# Patient Record
Sex: Female | Born: 1985 | Race: Black or African American | Hispanic: No | Marital: Single | Smoking: Former smoker
Health system: Southern US, Community
[De-identification: ages and names within clinical notes are randomized; demographics above are authoritative.]

## PROBLEM LIST (undated history)

## (undated) DIAGNOSIS — E0500 Thyrotoxicosis with diffuse goiter without thyrotoxic crisis or storm: Secondary | ICD-10-CM

## (undated) HISTORY — PX: NOSE SURGERY: SHX723

## (undated) HISTORY — PX: WRIST SURGERY: SHX841

## (undated) HISTORY — PX: ECTOPIC PREGNANCY SURGERY: SHX613

---

## 2015-03-10 ENCOUNTER — Encounter (HOSPITAL_COMMUNITY): Payer: Self-pay

## 2015-03-10 ENCOUNTER — Emergency Department (HOSPITAL_COMMUNITY)
Admission: EM | Admit: 2015-03-10 | Discharge: 2015-03-10 | Disposition: A | Discharge status: Home / Self Care | Payer: Medicaid Other | Attending: Emergency Medicine | Admitting: Emergency Medicine

## 2015-03-10 DIAGNOSIS — R000 Tachycardia, unspecified: Secondary | ICD-10-CM | POA: Diagnosis not present

## 2015-03-10 DIAGNOSIS — N730 Acute parametritis and pelvic cellulitis: Secondary | ICD-10-CM | POA: Diagnosis not present

## 2015-03-10 DIAGNOSIS — R42 Dizziness and giddiness: Secondary | ICD-10-CM | POA: Diagnosis present

## 2015-03-10 DIAGNOSIS — N760 Acute vaginitis: Secondary | ICD-10-CM | POA: Diagnosis not present

## 2015-03-10 DIAGNOSIS — B9689 Other specified bacterial agents as the cause of diseases classified elsewhere: Secondary | ICD-10-CM

## 2015-03-10 DIAGNOSIS — Z87891 Personal history of nicotine dependence: Secondary | ICD-10-CM | POA: Insufficient documentation

## 2015-03-10 DIAGNOSIS — E0590 Thyrotoxicosis, unspecified without thyrotoxic crisis or storm: Secondary | ICD-10-CM

## 2015-03-10 DIAGNOSIS — Z880 Allergy status to penicillin: Secondary | ICD-10-CM | POA: Diagnosis not present

## 2015-03-10 DIAGNOSIS — Z3202 Encounter for pregnancy test, result negative: Secondary | ICD-10-CM | POA: Insufficient documentation

## 2015-03-10 DIAGNOSIS — Z79899 Other long term (current) drug therapy: Secondary | ICD-10-CM | POA: Insufficient documentation

## 2015-03-10 HISTORY — DX: Thyrotoxicosis with diffuse goiter without thyrotoxic crisis or storm: E05.00

## 2015-03-10 LAB — CBC
HCT: 42.8 % (ref 36.0–46.0)
Hemoglobin: 14.8 g/dL (ref 12.0–15.0)
MCH: 28.6 pg (ref 26.0–34.0)
MCHC: 34.6 g/dL (ref 30.0–36.0)
MCV: 82.6 fL (ref 78.0–100.0)
PLATELETS: 319 10*3/uL (ref 150–400)
RBC: 5.18 MIL/uL — AB (ref 3.87–5.11)
RDW: 11.5 % (ref 11.5–15.5)
WBC: 5.3 10*3/uL (ref 4.0–10.5)

## 2015-03-10 LAB — BASIC METABOLIC PANEL
Anion gap: 9 (ref 5–15)
BUN: 12 mg/dL (ref 6–20)
CALCIUM: 9.8 mg/dL (ref 8.9–10.3)
CO2: 26 mmol/L (ref 22–32)
CREATININE: 0.58 mg/dL (ref 0.44–1.00)
Chloride: 103 mmol/L (ref 101–111)
GFR calc non Af Amer: >60 mL/min (ref >60)
Glucose, Bld: 153 mg/dL — ABNORMAL HIGH (ref 65–99)
Potassium: 3.4 mmol/L — ABNORMAL LOW (ref 3.5–5.1)
SODIUM: 138 mmol/L (ref 135–145)

## 2015-03-10 LAB — I-STAT BETA HCG BLOOD, ED (MC, WL, AP ONLY)

## 2015-03-10 LAB — WET PREP, GENITAL
SPERM: NONE SEEN
Trich, Wet Prep: NONE SEEN
YEAST WET PREP: NONE SEEN

## 2015-03-10 LAB — TSH: TSH: <0.01 u[IU]/mL — ABNORMAL LOW (ref 0.350–4.500)

## 2015-03-10 MED ORDER — DEXTROSE 5 % IV SOLN
1.0000 g | Freq: Once | INTRAVENOUS | Status: AC
  Administered 2015-03-10: 1 g via INTRAVENOUS
  Filled 2015-03-10: qty 10

## 2015-03-10 MED ORDER — METRONIDAZOLE 500 MG PO TABS: 500.0000 mg | ORAL_TABLET | Freq: Two times a day (BID) | ORAL | Status: DC

## 2015-03-10 MED ORDER — DOXYCYCLINE HYCLATE 100 MG PO CAPS: 100.0000 mg | ORAL_CAPSULE | Freq: Two times a day (BID) | ORAL | Status: DC

## 2015-03-10 MED ORDER — METRONIDAZOLE 500 MG PO TABS
500.0000 mg | ORAL_TABLET | Freq: Once | ORAL | Status: AC
  Administered 2015-03-10: 500 mg via ORAL
  Filled 2015-03-10: qty 1

## 2015-03-10 MED ORDER — SODIUM CHLORIDE 0.9 % IV BOLUS (SEPSIS)
1000.0000 mL | Freq: Once | INTRAVENOUS | Status: AC
  Administered 2015-03-10: 1000 mL via INTRAVENOUS

## 2015-03-10 MED ORDER — DOXYCYCLINE HYCLATE 100 MG PO TABS
100.0000 mg | ORAL_TABLET | Freq: Once | ORAL | Status: AC
  Administered 2015-03-10: 100 mg via ORAL
  Filled 2015-03-10: qty 1

## 2015-03-10 NOTE — ED Notes (Signed)
Pt presents with c/o tachycardia and dizziness. Pt reports she normally takes xanax and has been out of her prescription. Pt also reports she has Graves disease. Pt reports she has been feeling lightheaded and tired.

## 2015-03-10 NOTE — Discharge Instructions (Signed)
Please resume taking your hyperthyroid medications. It is very important to be compliant with these until you establish primary care and they can be adjusted properly.  Take your antibiotics as directed and to completion. You should never have any leftover antibiotics! Push fluids and stay well hydrated.   Any antibiotic use can reduce the efficacy of hormonal birth control. Please use back up method of contraception.   Do not drink alcohol while you are taking flagyl (metronidazole) because it will make you very sick.  Do not hesitate to return to the emergency room for any new, worsening or concerning symptoms.  Please obtain primary care using resource guide below. Let them know that you were seen in the emergency room and that they will need to obtain records for further outpatient management.   Pelvic Inflammatory Disease Pelvic inflammatory disease (PID) refers to an infection in some or all of the female organs. The infection can be in the uterus, ovaries, fallopian tubes, or the surrounding tissues in the pelvis. PID can cause abdominal or pelvic pain that comes on suddenly (acute pelvic pain). PID is a serious infection because it can lead to lasting (chronic) pelvic pain or the inability to have children (infertility). CAUSES This condition is most often caused by an infection that is spread during sexual contact. However, the infection can also be caused by the normal bacteria that are found in the vaginal tissues if these bacteria travel upward into the reproductive organs. PID can also occur following:  The birth of a baby.  A miscarriage.  An abortion.  Major pelvic surgery.  The use of an intrauterine device (IUD).  A sexual assault. RISK FACTORS This condition is more likely to develop in women who:  Are younger than 28 years of age.  Are sexually active at [facility] age.  Use nonbarrier contraception.  Have multiple sexual partners.  Have sex with someone who  has symptoms of an STD (sexually transmitted disease).  Use oral contraception. At times, certain behaviors can also increase the possibility of getting PID, such as:  Using a vaginal douche.  Having an IUD in place. SYMPTOMS Symptoms of this condition include:  Abdominal or pelvic pain.  Fever.  Chills.  Abnormal vaginal discharge.  Abnormal uterine bleeding.  Unusual pain shortly after the end of a menstrual period.  Painful urination.  Pain with sexual intercourse.  Nausea and vomiting. DIAGNOSIS To diagnose this condition, your health care provider will do a physical exam and take your medical history. A pelvic exam typically reveals great tenderness in the uterus and the surrounding pelvic tissues. You may also have tests, such as:  Lab tests, including a pregnancy test, blood tests, and urine test.  Culture tests of the vagina and cervix to check for an STD.  Ultrasound.  A laparoscopic procedure to look inside the pelvis.  Examining vaginal secretions under a microscope. TREATMENT Treatment for this condition may involve one or more approaches.  Antibiotic medicines may be prescribed to be taken by mouth.  Sexual partners may need to be treated if the infection is caused by an STD.  For more severe cases, hospitalization may be needed to give antibiotics directly into a vein through an IV tube.  Surgery may be needed if other treatments do not help, but this is rare. It may take weeks until you are completely well. If you are diagnosed with PID, you should also be checked for human immunodeficiency virus (HIV). Your health care provider may test you  for infection again 3 months after treatment. You should not have unprotected sex. HOME CARE INSTRUCTIONS  Take over-the-counter and prescription medicines only as told by your health care provider.  If you were prescribed an antibiotic medicine, take it as told by your health care provider. Do not stop taking  the antibiotic even if you start to feel better.  Do not have sexual intercourse until treatment is completed or as told by your health care provider. If PID is confirmed, your recent sexual partners will need treatment, especially if you had unprotected sex.  Keep all follow-up visits as told by your health care provider. This is important. SEEK MEDICAL CARE IF:  You have increased or abnormal vaginal discharge.  Your pain does not improve.  You vomit.  You have a fever.  You cannot tolerate your medicines.  Your partner has an STD.  You have pain when you urinate. SEEK IMMEDIATE MEDICAL CARE IF:  You have increased abdominal or pelvic pain.  You have chills.  Your symptoms are not better in 72 hours even with treatment.   This information is not intended to replace advice given to you by your health care provider. Make sure you discuss any questions you have with your health care provider.   Document Released: 03/15/2005 Document Revised: 12/04/2014 Document Reviewed: 04/22/2014 Elsevier Interactive Patient Education 2016 [employer].    Emergency Department Resource Guide 1) Find a Doctor and Pay Out of Pocket Although you won't have to find out who is covered by your insurance plan, it is a good idea to ask around and get recommendations. You will then need to call the office and see if the doctor you have chosen will accept you as a new patient and what types of options they offer for patients who are self-pay. Some doctors offer discounts or will set up payment plans for their patients who do not have insurance, but you will need to ask so you aren't surprised when you get to your appointment.  2) Contact Your Local Health Department Not all health departments have doctors that can see patients for sick visits, but many do, so it is worth a call to see if yours does. If you don't know where your local health department is, you can check in your phone book. The CDC also  has a tool to help you locate your state's health department, and many state websites also have listings of all of their local health departments.  3) Find a Walk-in Clinic If your illness is not likely to be very severe or complicated, you may want to try a walk in clinic. These are popping up all over the country in pharmacies, drugstores, and shopping centers. They're usually staffed by nurse practitioners or physician assistants that have been trained to treat common illnesses and complaints. They're usually fairly quick and inexpensive. However, if you have serious medical issues or chronic medical problems, these are probably not your best option.  No Primary Care Doctor: - Call Health Connect at  [phone] - they can help you locate a primary care doctor that  accepts your insurance, provides certain services, etc. - Physician Referral Service- [phone]  Chronic Pain Problems: Organization         Address  Phone   Notes  [facility]  [phone] Patients need to be referred by their primary care doctor.   Medication Assistance: Retail buyer  Notes  [facility] Medication [facility] [address] [place] [place], [place] [address] [phone] --Must be a resident of [facility] -- Must have NO insurance coverage whatsoever (no Medicaid/ Medicare, etc.) -- The pt. MUST have a primary care doctor that directs their care regularly and follows them in the community   [facility]ist  [phone]   [employer]  [phone]    Agencies that provide inexpensive medical care: Organization         Address  Phone   Notes  [facility]  [phone]   [facility]    [phone]   [facility] [address] [place], [place] [address] [phone]   [facility] [address] [place] [phone]   Planned Parenthood    [phone]    [facility]    [phone]   [facility]  [address], [place] Phone:  [phone], Fax:  [phone] Hours of Operation:  9 am - 6 pm, M-F.  Also accepts Medicaid/Medicare and self-pay.  [facility]  [address], [place] Phone: [phone], Fax: [phone]. Hours of Operation:  8:30 am - 5:30 pm, M-F.  Also accepts Medicaid and self-pay.  [facility] [place] [address], [place] Phone: [phone]   [facility] [address] Natasha Bence [place], [place] [phone], Ext. [phone] Mondays & Thursdays: 7-9 AM.  First 15 patients are seen on a first come, first serve basis.    Medicaid-accepting [facility] Providers:  Organization         Address  Phone   Notes  [facility] [address], [place] [phone] Also accepts self-pay patients.  [facility] [address] Laurell Josephs [place], [place]  [phone]   [facility] [address], [place] [phone]   [facility] [address], [place] [phone]   Renaye Rakers [address], [place]   [phone] Only accepts [place] Access [place] patients after they have their name applied to their card.   Self-Pay (no insurance) in [employer]:  Organization         Address  Phone   Notes  Sickle Cell Patients, [facility] [address] [place], [place] [phone]   [facility] [address] [place], [place] [phone]   [facility] [place]  [address], [place] [phone]   [facility]/Dr. Osei-Bonsu  [address] [place] or [address], [place] [phone] Phone number for both [place] and [place] locations is the same.  [facility] [address] [place] [phone]   [facility] [address], [place] or [address] [phone]   [facility] [address], [place] [phone], phone; [phone], fax Sees patients 1st and 3rd Saturday of every month.  Must not qualify for public or private insurance (i.e. Medicaid, Medicare, [place] Health Choice, Veterans' Benefits)  Household income should be no more than 200% of the poverty level The clinic cannot treat you if you are pregnant or think you are pregnant  Sexually transmitted  diseases are not treated at the clinic.    Dental Care: Organization         Address  Phone  Notes  [facility] [address], [place] [phone] Accepts children up to age 28 who are enrolled in [place] or [place] Health Choice; pregnant women with a Medicaid card; and children who have applied for Medicaid or [place] Health Choice, but were declined, whose parents can pay a reduced fee at time of service.  [facility] [place]  [address], [place] [phone] Accepts children up to age 28 who are enrolled in [place] or [place] Health Choice; pregnant women with a Medicaid card; and children who have applied for Medicaid or  Health Choice, but were declined, whose parents can pay a reduced fee at time of service.  [facility]  [address], [place] [phone] Patients are seen by appointment only. Walk-ins are not accepted. [facility] will see patients 28 years of age and older. Monday - Tuesday (8am-5pm) Most Wednesdays (8:30-5pm) $30 per visit, cash only  [facility]  [address], [facility] [phone] Patients are seen by appointment only. Walk-ins are not accepted. [facility] will see patients 28 years of age and older. One Wednesday  Evening (Monthly: Volunteer Based).  $30 per visit, cash only  [facility]  [phone] for adults; Children under age 24, call Graduate Pediatric Dentistry at [phone]. Children aged 634-14, please call [phone] to request a pediatric application.  Dental services are provided in all areas of dental care including fillings, crowns and bridges, complete and partial dentures, implants, gum treatment, root canals, and extractions. Preventive care is also provided. Treatment is provided to both adults and children. Patients are selected via a lottery and there is often a waiting list.   [facility] [address], [place]  [phone] [email or web address]   [facility] [address], [place], [place] [phone], Ext. [phone] Second and Fourth Thursday of each month, opens at 6:30 AM; Clinic ends at 9 AM.  Patients are seen on a first-come first-served basis, and a limited number are seen during each clinic.   [facility]  [address], [place], [place] [phone]   Eligibility Requirements You must have lived in [place], [place], or [place] counties for at least the last three months.   You cannot be eligible for state or federal sponsored [place] Cityhealthcare insurance, including CIGNAVeterans Administration, [place]icaid, or [employer]icare.   You generally cannot be eligible for healthcare insurance through your employer.    How to apply: Eligibility screenings are held every Tuesday and Wednesday afternoon from 1:00 pm until 4:00 pm. You do not need an appointment for the interview!  [facility] [address], [place], [place] [phone]   [facility]  [phone]   [facility]  [phone]   [facility]  [phone]    Behavioral Health Resources in the Community: Intensive Outpatient Programs Organization         Address  Phone  Notes  [facility] [address], [place], [place] [phone]   [facility] [address], [place], [place] [phone]   [facility] [address], [place], [place]  [phone]  [facility] [address]  [place], [place] [phone] or [phone]   Substance Abuse Resources Organization         Address  Phone  Notes  [facility] Drug [facility]  [phone]   [facility]  [phone]   [facility]  [phone]   Floydene Flock  [phone]   Residential & Outpatient Substance Abuse Program  [phone]   Psychological Services Organization         Address  Phone  Notes  [facility]  [phone]   [facility] Services  [phone]   [facility] Health [address] [place] [phone] or [phone]    Mobile Crisis Teams Organization         Address  Phone  Notes  [facility], Mobile Crisis Care Unit  [phone]   [facility]  [address] [place], [place] [phone]   Doristine Locks [address] [place] [phone]    Self-Help/Support Groups Organization         Address  Phone             Notes  Mental Health Assoc. of  - variety of support groups  [phone]- I7437963 Call for more information  Narcotics Anonymous (NA), [facility] [address], [employer] [place]  2 meetings at this location   Statistician         Address  Phone  Notes  [facility] [address],    [place]  [phone]   [facility]  [address], [place] [address], [place], [place] [phone]   [facility] [address] [place], [place] [phone] Admissions: 8am-3pm M-F  [facility] [address]    [place], [place] [phone]   [facility] [address] [place],  [place], [place] [phone]   [facility] [address]  [place], [place] [phone]   [facility] - Intensive Outpatient [address]ure[address], [place], [place] [phone]   [facility] [address] [place]  [place], [place] [phone] or [phone]   [facility] ([facility]) [address] [place], [place] [phone] Accepts Medicaid  [facility] [address],  [place] [phone] Substance Abuse/Addiction Treatment   [facility] Organization         Address  Phone  Notes  [facility]  [phone]   Angie Fava, PhD [address] Ervin Knack [place], [place]   [phone] or [phone]   [facility] Behavioral   [address] [place], [place] [phone]   [facility] [address] [place], [place] [phone] Insurance/Medicaid/sponsorship through [facility] [address]                                    [place], [place] [phone] Therapy/tele-psych/case  [facility] [address], [place] [phone]    Dr. Lolly Mustache  [phone]   [facility]  [employer] [facility] Dept. 1) [address] [place] 2) [address], [place] 3)  [address], [place] [phone]  [phone]   [facility] Child Abuse Hotline [phone] or [phone] (After Hours)

## 2015-03-10 NOTE — ED Notes (Addendum)
EDP Tara Wallace(Nicole PA) at bedside. Pt states she has not been taking her medication for her graves disease

## 2015-03-10 NOTE — ED Provider Notes (Signed)
CSN: [id number]     Arrival date & time 03/10/15  1704 History   First MD Initiated Contact with Patient 03/10/15 1927     Chief Complaint  Patient presents with  . Tachycardia  . Dizziness     (Consider location/radiation/quality/duration/timing/severity/associated sxs/prior Treatment) HPI   Blood pressure 120/79, pulse 123, temperature 97.8 F (36.6 C), temperature source Oral, resp. rate 18, last menstrual period 02/01/2015, SpO2 97 %.  Tara Wallace is a 29 y.o. female complaining of tachycardia and excessive sleepiness over the course of last 2 weeks. Patient states this is similar to when she was diagnosed with hyperthyroid/Graves'. Patient recently stopped taking her methimazole and propranolol that a week ago days ago. States that it was not helping her. Patient recently moved to the area and has not established primary care. Patient denies chest pain, shortness of breath, history of DVT or PE. Patient states that her ex boyfriend reports that he has an STD, patient denies abnormal vaginal discharge, fever, chills, abdominal pain, rash or lesion.  Past Medical History  Diagnosis Date  . Graves disease    Past Surgical History  Procedure Laterality Date  . Ectopic pregnancy surgery    . Nose surgery    . Wrist surgery     No family history on file. Social History  Substance Use Topics  . Smoking status: Former Games developer  . Smokeless tobacco: None  . Alcohol Use: Yes     Comment: occasionally    OB History    No data available     Review of Systems  10 systems reviewed and found to be negative, except as noted in the HPI.   Allergies  Penicillins  Home Medications   Prior to Admission medications   Medication Sig Start Date End Date Taking? Authorizing Provider  ALPRAZOLAM PO Take 0.5 mg by mouth 2 (two) times daily as needed (anxiety.).    Yes Historical Provider, MD  doxycycline (VIBRAMYCIN) 100 MG capsule Take 1 capsule (100 mg total) by mouth 2 (two) times  daily. 03/10/15   Atley Scarboro, PA-C  methimazole (TAPAZOLE) 10 MG tablet Take 10 mg by mouth 2 (two) times daily.   Yes Historical Provider, MD  metroNIDAZOLE (FLAGYL) 500 MG tablet Take 1 tablet (500 mg total) by mouth 2 (two) times daily. 03/10/15   Dorri Ozturk, PA-C  PROPRANOLOL HCL PO Take 40 mg by mouth 3 (three) times daily.    Yes Historical Provider, MD   BP 140/67 mmHg  Pulse 119  Temp(Src) 97.8 F (36.6 C) (Oral)  Resp 23  SpO2 99%  LMP 02/01/2015 (Approximate) Physical Exam  Constitutional: She is oriented to person, place, and time. She appears well-developed and well-nourished. No distress.  HENT:  Head: Normocephalic.  Mouth/Throat: Oropharynx is clear and moist.  Eyes: Conjunctivae and EOM are normal. Pupils are equal, round, and reactive to light.  Neck: Normal range of motion. No JVD present. No tracheal deviation present.  Cardiovascular: Regular rhythm and intact distal pulses.   Tachycardic in the 1 teens  Pulmonary/Chest: Effort normal and breath sounds normal. No stridor. No respiratory distress. She has no wheezes. She has no rales. She exhibits no tenderness.  Abdominal: Soft. Bowel sounds are normal. She exhibits no distension and no mass. There is no tenderness. There is no rebound and no guarding.  Genitourinary:  Pelvic exam chaperoned by technician: No rashes or lesions, there is a thick, white vaginal discharge. Mild cervical motion tenderness with no adnexal tenderness  Musculoskeletal: Normal  range of motion. She exhibits no edema or tenderness.  No calf asymmetry, superficial collaterals, palpable cords, edema, Homans sign negative bilaterally.    Neurological: She is alert and oriented to person, place, and time.  Skin: Skin is warm. She is not diaphoretic.  Psychiatric: She has a normal mood and affect.  Nursing note and vitals reviewed.   ED Course  Procedures (including critical care time) Labs Review Labs Reviewed  WET PREP,  GENITAL - Abnormal; Notable for the following:    Clue Cells Wet Prep HPF POC PRESENT (*)    WBC, Wet Prep HPF POC MANY (*)    All other components within normal limits  BASIC METABOLIC PANEL - Abnormal; Notable for the following:    Potassium 3.4 (*)    Glucose, Bld 153 (*)    All other components within normal limits  CBC - Abnormal; Notable for the following:    RBC 5.18 (*)    All other components within normal limits  TSH - Abnormal; Notable for the following:    TSH <0.010 (*)    All other components within normal limits  URINALYSIS, ROUTINE W REFLEX MICROSCOPIC (NOT AT [facility])  RPR  HIV ANTIBODY (ROUTINE TESTING)  T4, FREE  I-STAT BETA HCG BLOOD, ED ([facility], [facility], [facility] ONLY)  GC/CHLAMYDIA PROBE AMP ([place]) NOT AT [facility]    Imaging Review No results found. I have personally reviewed and evaluated these images and lab results as part of my medical decision-making.   EKG Interpretation   Date/Time:  Monday March 10 2015 17:10:36 EST Ventricular Rate:  118 PR Interval:  125 QRS Duration: 79 QT Interval:  300 QTC Calculation: 420 R Axis:   66 Text Interpretation:  Sinus tachycardia no prior ekg for comparison  Confirmed by LIU MD, DANA [phone]) on 03/10/2015 11:01:49 PM      MDM   Final diagnoses:  Hyperthyroidism  Bacterial vaginosis  PID (acute pelvic inflammatory disease)    Filed Vitals:   03/10/15 1711 03/10/15 2000 03/10/15 2030 03/10/15 2100  BP: 120/79 108/71 128/80 140/67  Pulse: 123 115 101 119  Temp: 97.8 F (36.6 C)     TempSrc: Oral     Resp: SpO2: 97% 99% 100% 99%    Medications  cefTRIAXone (ROCEPHIN) 1 g in dextrose 5 % 50 mL IVPB (1 g Intravenous New Bag/Given 03/10/15 2236)  sodium chloride 0.9 % bolus 1,000 mL (1,000 mLs Intravenous New Bag/Given 03/10/15 2025)  doxycycline (VIBRA-TABS) tablet 100 mg (100 mg Oral Given 03/10/15 2235)  metroNIDAZOLE (FLAGYL) tablet 500 mg (500 mg Oral Given 03/10/15 2235)    [name]  is 29 y.o. female presenting with tachycardia and somnolence consistent with prior episode in which her hyperthyroidism was initially diagnosed. Patient was taking methimazole and propranolol but she stopped taking this approximately one week ago. She states it wasn't helping her. She is mildly tachycardic with a heart rate of 119. EKG shows sinus tachycardia. Patient without chest pain. TSH is undetectable. I doubt that this is thyroid storm, she is afebrile mentating normally.  Patient also requests to be checked for STDs. She has mild cervical motion tenderness and thick discharge. Wet prep shows clue cells and many white blood cells. We'll treat for BV/PID. Patient advised to follow closely with primary care.  Evaluation does not show pathology that would require ongoing emergent intervention or inpatient treatment. Pt is hemodynamically stable and mentating appropriately. Discussed findings and plan with patient/guardian, who  agrees with care plan. All questions answered. Return precautions discussed and outpatient follow up given.   New Prescriptions   DOXYCYCLINE (VIBRAMYCIN) 100 MG CAPSULE    Take 1 capsule (100 mg total) by mouth 2 (two) times daily.   METRONIDAZOLE (FLAGYL) 500 MG TABLET    Take 1 tablet (500 mg total) by mouth 2 (two) times daily.         Wynetta Emeryicole Raman Featherston, PA-C 03/10/15 2302  Lavera Guiseana Duo Liu, MD 03/12/15 [phone]

## 2015-03-10 NOTE — ED Notes (Signed)
Pt will be discharged after her rocephin is finished running.

## 2015-03-11 LAB — HIV ANTIBODY (ROUTINE TESTING W REFLEX): HIV Screen 4th Generation wRfx: NONREACTIVE

## 2015-03-11 LAB — GC/CHLAMYDIA PROBE AMP (~~LOC~~) NOT AT ARMC
CHLAMYDIA, DNA PROBE: POSITIVE — AB
NEISSERIA GONORRHEA: NEGATIVE

## 2015-03-11 LAB — RPR: RPR Ser Ql: NONREACTIVE

## 2015-03-11 LAB — T4, FREE: FREE T4: 4.26 ng/dL — AB (ref 0.61–1.12)

## 2015-03-12 ENCOUNTER — Telehealth (HOSPITAL_BASED_OUTPATIENT_CLINIC_OR_DEPARTMENT_OTHER): Payer: Self-pay | Admitting: Emergency Medicine

## 2015-03-12 NOTE — Telephone Encounter (Signed)
+   chlamydia, was treated , attempting to contact patient

## 2015-03-21 ENCOUNTER — Telehealth (HOSPITAL_BASED_OUTPATIENT_CLINIC_OR_DEPARTMENT_OTHER): Payer: Self-pay | Admitting: Emergency Medicine

## 2015-05-20 ENCOUNTER — Telehealth (HOSPITAL_BASED_OUTPATIENT_CLINIC_OR_DEPARTMENT_OTHER): Payer: Self-pay | Admitting: Emergency Medicine

## 2015-06-04 DIAGNOSIS — E0590 Thyrotoxicosis, unspecified without thyrotoxic crisis or storm: Secondary | ICD-10-CM

## 2015-06-04 NOTE — Congregational Nurse Program (Signed)
Congregational Nurse Program Note  Date of Encounter: 06/04/2015  Past Medical History: Past Medical History  Diagnosis Date  . Graves disease     Encounter Details:     CNP Questionnaire - 06/04/15 1855    Patient Demographics   Is this a new or existing patient? New   Patient is considered a/an Not Applicable   Race African-American/Black   Patient Assistance   Location of Patient Assistance Not Applicable   Patient's financial/insurance status Medicaid   Uninsured Patient No   Patient referred to apply for the following financial assistance Medicaid   Food insecurities addressed Not Applicable   Transportation assistance No   Assistance securing medications No   Educational health offerings Chronic disease   Encounter Details   Primary purpose of visit Education/Health Concerns;Chronic Illness/Condition Visit;Spiritual Care/Support Visit   Was an Emergency Department visit averted? Not Applicable   Does patient have a medical provider? No   Patient referred to Establish PCP   Was a mental health screening completed? (GAINS tool) No   Does patient have dental issues? No   Does patient have vision issues? No   Since previous encounter, have you referred patient for abnormal blood pressure that resulted in a new diagnosis or medication change? No   Since previous encounter, have you referred patient for abnormal blood glucose that resulted in a new diagnosis or medication change? No   For Abstraction Use Only   Does patient have insurance? Yes    Initial  Visit  To  See nurse  , referred by  Case manager . States  She  Has a history of  Hyperthyroidism, takes her medication.Doesn't  Know  Why  She  Was referred to  The  Nurse. Nurse attempted to  Get  Client  To  Talk about  Self and has 3 children ,2 boys  Ages 498,6 and 1 daughter age 35.  States  Her daughter needs immunizations , not in preschool  But  Will start in the  Fall , [place] . Boys  Are attending   [employer].Nurse  Counseled  Regarding  Need to  Go DSS to  Have  Med[place]  [place]  And to  [facility]  [facility]  Here. Gave  Mother  A list of  Practices  Taking  New patients  For all  Family  Members and  Address for  DSS. Ask that she  Come  Weekly  To  Update  Nurse on her follow  Up  With  identifying  Medical homes. States she  Wanted to  Come back later as children have  gotten on her nerves , nurse counseled  and tried to  Make mother feel comfortable  to talking with  Me regarding her needs. Follow  Up  With  Case manger on client disposition and her attitude today . Client had other issues going on . Will follow  Up  Next week.

## 2015-06-06 ENCOUNTER — Emergency Department (HOSPITAL_COMMUNITY)
Admission: EM | Admit: 2015-06-06 | Discharge: 2015-06-06 | Disposition: A | Discharge status: Home / Self Care | Payer: Medicaid Other | Attending: Emergency Medicine | Admitting: Emergency Medicine

## 2015-06-06 ENCOUNTER — Encounter (HOSPITAL_COMMUNITY): Payer: Self-pay | Admitting: Emergency Medicine

## 2015-06-06 ENCOUNTER — Emergency Department (HOSPITAL_COMMUNITY): Payer: Medicaid Other

## 2015-06-06 DIAGNOSIS — Z3202 Encounter for pregnancy test, result negative: Secondary | ICD-10-CM | POA: Diagnosis not present

## 2015-06-06 DIAGNOSIS — Z87891 Personal history of nicotine dependence: Secondary | ICD-10-CM | POA: Diagnosis not present

## 2015-06-06 DIAGNOSIS — R002 Palpitations: Secondary | ICD-10-CM | POA: Diagnosis present

## 2015-06-06 DIAGNOSIS — Z792 Long term (current) use of antibiotics: Secondary | ICD-10-CM | POA: Diagnosis not present

## 2015-06-06 DIAGNOSIS — E0590 Thyrotoxicosis, unspecified without thyrotoxic crisis or storm: Secondary | ICD-10-CM | POA: Diagnosis not present

## 2015-06-06 DIAGNOSIS — Z880 Allergy status to penicillin: Secondary | ICD-10-CM | POA: Diagnosis not present

## 2015-06-06 DIAGNOSIS — R600 Localized edema: Secondary | ICD-10-CM | POA: Insufficient documentation

## 2015-06-06 DIAGNOSIS — Z79899 Other long term (current) drug therapy: Secondary | ICD-10-CM | POA: Insufficient documentation

## 2015-06-06 DIAGNOSIS — R000 Tachycardia, unspecified: Secondary | ICD-10-CM | POA: Diagnosis not present

## 2015-06-06 LAB — CBC
HCT: 35.5 % — ABNORMAL LOW (ref 36.0–46.0)
Hemoglobin: 11.8 g/dL — ABNORMAL LOW (ref 12.0–15.0)
MCH: 27.1 pg (ref 26.0–34.0)
MCHC: 33.2 g/dL (ref 30.0–36.0)
MCV: 81.6 fL (ref 78.0–100.0)
Platelets: 202 K/uL (ref 150–400)
RBC: 4.35 MIL/uL (ref 3.87–5.11)
RDW: 12.2 % (ref 11.5–15.5)
WBC: 4.6 K/uL (ref 4.0–10.5)

## 2015-06-06 LAB — BASIC METABOLIC PANEL
Anion gap: 8 (ref 5–15)
BUN: 10 mg/dL (ref 6–20)
CHLORIDE: 105 mmol/L (ref 101–111)
CO2: 25 mmol/L (ref 22–32)
CREATININE: 0.38 mg/dL — AB (ref 0.44–1.00)
Calcium: 9.6 mg/dL (ref 8.9–10.3)
GFR calc non Af Amer: >60 mL/min (ref >60)
GLUCOSE: 93 mg/dL (ref 65–99)
Potassium: 4.3 mmol/L (ref 3.5–5.1)
Sodium: 138 mmol/L (ref 135–145)

## 2015-06-06 LAB — TSH: TSH: <0.01 u[IU]/mL — ABNORMAL LOW (ref 0.350–4.500)

## 2015-06-06 LAB — T4, FREE: FREE T4: 4.65 ng/dL — AB (ref 0.61–1.12)

## 2015-06-06 LAB — I-STAT TROPONIN, ED: Troponin i, poc: 0 ng/mL (ref 0.00–0.08)

## 2015-06-06 LAB — POC URINE PREG, ED: Preg Test, Ur: NEGATIVE

## 2015-06-06 LAB — BRAIN NATRIURETIC PEPTIDE: B Natriuretic Peptide: 26.9 pg/mL (ref 0.0–100.0)

## 2015-06-06 MED ORDER — SODIUM CHLORIDE 0.9 % IV BOLUS (SEPSIS)
1000.0000 mL | Freq: Once | INTRAVENOUS | Status: AC
  Administered 2015-06-06: 1000 mL via INTRAVENOUS

## 2015-06-06 MED ORDER — SODIUM CHLORIDE 0.9 % IV SOLN
INTRAVENOUS | Status: DC
Start: 2015-06-06 — End: 2015-06-06
  Administered 2015-06-06: 18:00:00 via INTRAVENOUS

## 2015-06-06 MED ORDER — PROPRANOLOL HCL 20 MG PO TABS: 40.0000 mg | ORAL_TABLET | Freq: Three times a day (TID) | ORAL | Status: DC

## 2015-06-06 MED ORDER — METHIMAZOLE 10 MG PO TABS: 40.0000 mg | ORAL_TABLET | Freq: Two times a day (BID) | ORAL | Status: AC

## 2015-06-06 NOTE — ED Notes (Signed)
Lab delay - Pt doesn't want to be stuck twice - is opting to wait for IV.  Triage RN aware.

## 2015-06-06 NOTE — Progress Notes (Signed)
Medicaid [place] access response hx indicates pt pcp as  [facility] [address] Kendra [place], [place] [phone]

## 2015-06-06 NOTE — ED Notes (Addendum)
Pt reports palpitations since last night. HR 103 in triage. Pt has hx of anxiety, took home ativan at 0800 this am which decreased symptoms. Pt also reports L CP and HA.

## 2015-06-06 NOTE — Progress Notes (Signed)
Entered in d/c instructions  [facility] Schedule an appointment as soon as possible for a visit This is your assigned Medicaid [place] access doctor If you prefer another contact DSS [phone] DSS assigned your doctor *You may receive a bill if you go to any family Dr not assigned to you Medicaid [place] access response hx indicates [facility] [address] Kendra Op[place] [place], [place] [phone] medicaid [place]: [phone] [address] [place], [place] [id number] [email or web address].ncdhhs.gov/ Use this website to assist with understanding your coverage & to renew application As a Medicaid client you MUST contact DSS/SSI each time you change address, move to another [place] or another state to keep your address updated  [place] Medicaid Transportation to Dr appts if you are have full Medicaid: [phone], [phone]

## 2015-06-06 NOTE — ED Provider Notes (Signed)
CSN: [id number]     Arrival date & time 06/06/15  1124 History   First MD Initiated Contact with Patient 06/06/15 1554     Chief Complaint  Patient presents with  . Palpitations     (Consider location/radiation/quality/duration/timing/severity/associated sxs/prior Treatment) HPI   30 year old female with hx of Graves disease presenting with heart palpation and chest pain.  Pt sts for the past several months she has been having sxs concerning for Graves disease.  She report unexplained 30 lbs weight loss in 1 month, she has generalized weakness, dizzy spell, emotional lability, anxiety, occasion chest discomfort, generalized weakness, shortness of breath on exertion and sleeping a lot more than usual.  She had a syncopal episode yesterday and continues to feel fatigue with heart palpitation today. She have been having bilateral lower extremities swelling for several months. Endorse leg tremors and legs giving out on her for the past several days. She was diagnosed with Graves disease 2 years ago and have been taking methimazole and propranolol without adequate improvement.  She last saw her endocrinologist 1 year ago and was told she will need laser procedure of the thyroid but pt elected not to go with that option.  She has been taking ativan as needed for her anxiety with some improvement, last dose was yesterday.  She is having difficulty caring for her 3 young childs due to her generalized fatigue.  Pt is a former smoker, she drinks alcohol on occasion.  She denies any other cardiac hx, no prior hx of PE/DVT.    Past Medical History  Diagnosis Date  . Graves disease    Past Surgical History  Procedure Laterality Date  . Ectopic pregnancy surgery    . Nose surgery    . Wrist surgery     History reviewed. No pertinent family history. Social History  Substance Use Topics  . Smoking status: Former Games developer  . Smokeless tobacco: None  . Alcohol Use: Yes     Comment: occasionally    OB  History    No data available     Review of Systems  All other systems reviewed and are negative.     Allergies  Penicillins  Home Medications   Prior to Admission medications   Medication Sig Start Date End Date Taking? Authorizing Provider  ALPRAZOLAM PO Take 0.5 mg by mouth 2 (two) times daily as needed (anxiety.).     Historical Provider, MD  doxycycline (VIBRAMYCIN) 100 MG capsule Take 1 capsule (100 mg total) by mouth 2 (two) times daily. 03/10/15   Nicole Pisciotta, PA-C  methimazole (TAPAZOLE) 10 MG tablet Take 10 mg by mouth 2 (two) times daily.    Historical Provider, MD  metroNIDAZOLE (FLAGYL) 500 MG tablet Take 1 tablet (500 mg total) by mouth 2 (two) times daily. 03/10/15   Nicole Pisciotta, PA-C  PROPRANOLOL HCL PO Take 40 mg by mouth 3 (three) times daily.     Historical Provider, MD   BP 120/70 mmHg  Pulse 103  Temp(Src) 97.7 F (36.5 C) (Oral)  Resp 18  SpO2 99%  LMP 06/04/2015 Physical Exam  Constitutional: She is oriented to person, place, and time. She appears well-developed and well-nourished. No distress.  AAF lying in bed, no acute discomfort.  HENT:  Head: Atraumatic.  Right Ear: External ear normal.  Left Ear: External ear normal.  No proptosis or exopthalmos  Eyes: Conjunctivae are normal. Pupils are equal, round, and reactive to light.  Neck: Normal range of motion. Neck  supple. JVD present. No thyromegaly present.  Cardiovascular: Intact distal pulses.   Tachycardia without M/R/G  Pulmonary/Chest: Effort normal and breath sounds normal. No stridor. No respiratory distress. She has no wheezes. She has no rales. She exhibits no tenderness.  Abdominal: Soft. There is no tenderness.  Musculoskeletal: She exhibits edema (bilateral 1+ pitting edema to lower extremities).  4/5 strengths to all 4 extremities without focal weakness  Neurological: She is alert and oriented to person, place, and time.  Skin: No rash noted.  Psychiatric: She has a normal  mood and affect.  Nursing note and vitals reviewed.   ED Course  Procedures (including critical care time) Labs Review Labs Reviewed  BASIC METABOLIC PANEL - Abnormal; Notable for the following:    Creatinine, Ser 0.38 (*)    All other components within normal limits  TSH - Abnormal; Notable for the following:    TSH <0.010 (*)    All other components within normal limits  T4, FREE - Abnormal; Notable for the following:    Free T4 4.65 (*)    All other components within normal limits  CBC - Abnormal; Notable for the following:    Hemoglobin 11.8 (*)    HCT 35.5 (*)    All other components within normal limits  BRAIN NATRIURETIC PEPTIDE  T3, FREE  I-STAT TROPOININ, ED  POC URINE PREG, ED    Imaging Review Dg Chest 2 View  06/06/2015  CLINICAL DATA:  Chest pain EXAM: CHEST  2 VIEW COMPARISON:  None. FINDINGS: The heart size and mediastinal contours are within normal limits. Both lungs are clear. Thoracic dextroscoliosis. IMPRESSION: No active cardiopulmonary disease. Electronically Signed   By: Marlan Palau M.D.   On: 06/06/2015 13:13   I have personally reviewed and evaluated these images and lab results as part of my medical decision-making.   EKG Interpretation None     ED ECG REPORT   Date: 06/06/2015  Rate: 108  Rhythm: sinus tachycardia  QRS Axis: normal  Intervals: normal  ST/T Wave abnormalities: ST elevations diffusely  Conduction Disutrbances:none  Narrative Interpretation: ST elevation suggests acute pericarditis  Old EKG Reviewed: none available  I have personally reviewed the EKG tracing and agree with the computerized printout as noted.   MDM   Final diagnoses:  Hyperthyroidism    BP 128/65 mmHg  Pulse 103  Temp(Src) 97.7 F (36.5 C) (Oral)  Resp 24  SpO2 100%  LMP 06/04/2015   4:19 PM Pt presents with sxs suggestive of uncontrolled hyperthyroidism in the setting of Graves disease.  Work up initiated.  Will check for signs of high  output heart failure.  Pt is mentating appropriately, doubt thyroitoxicosis.  Pt however has had several syncopal episodes along with having increase generalized weakness and fatigue.  She has not seen an endocrinologist for over a year and although report being compliant with her thyroid medication her sxs worsen.  She may benefit for admission and get linked up with an endocrinologist.  Care discussed with DR. Verdie Mosher.    6:38 PM Patient with normal orthostatic vital signs. She remains tachycardic despite receiving IV fluid.her pregnancy test is negative. EKG shows tach exacerbation to suggest acute pericarditis but I do not think her symptoms consistence with pericarditis. She has normal BNP. Her TSH is low consistence with hypothyroidism. Free T3 in T4 has not resulted yet. the chest x-rays normals no signs of pleural effusion. EKG without signs of afib.    7:51 PM Pt is not in  thyroid storm.  Appreciate consultation from endocrinologist DR. Nelwyn SalisburyShawn Ellison [phone]) who suggest pt increasing Methimazole to 40mg  BID and to f/u in his office on Tuesday at 8am for further care.  Return precaution discussed.    Fayrene HelperBowie Kalene Cutler, PA-C 06/06/15 2003  Lavera Guiseana Duo Liu, MD 06/07/15 [phone]

## 2015-06-06 NOTE — Discharge Instructions (Signed)
Please follow up at Dr. Gregary SignsSean Ellison's office on Tuesday at 8am ([address] [phone]) [phone] for further evaluation and management of your thyroid disease.  Take Methimazole and Propranolol as prescribed.  Return to ER if your symptoms worsen or if you have other concerns.    Hyperthyroidism Hyperthyroidism is when the thyroid is too active (overactive). Your thyroid is a large gland that is located in your neck. The thyroid helps to control how your body uses food (metabolism). When your thyroid is overactive, it produces too much of a hormone called thyroxine.  CAUSES Causes of hyperthyroidism may include:  Graves disease. This is when your immune system attacks the thyroid gland. This is the most common cause.  Inflammation of the thyroid gland.  Tumor in the thyroid gland or somewhere else.  Excessive use of thyroid medicines, including:  Prescription thyroid supplement.  Herbal supplements that mimic thyroid hormones.  Solid or fluid-filled lumps within your thyroid gland (thyroid nodules).  Excessive ingestion of iodine. RISK FACTORS  Being female.  Having a family history of thyroid conditions. SIGNS AND SYMPTOMS Signs and symptoms of hyperthyroidism may include:  Nervousness.  Inability to tolerate heat.  Unexplained weight loss.  Diarrhea.  Change in the texture of hair or skin.  Heart skipping beats or making extra beats.  Rapid heart rate.  Loss of menstruation.  Shaky hands.  Fatigue.  Restlessness.  Increased appetite.  Sleep problems.  Enlarged thyroid gland or nodules. DIAGNOSIS  Diagnosis of hyperthyroidism may include:  Medical history and physical exam.  Blood tests.  Ultrasound tests. TREATMENT Treatment may include:  Medicines to control your thyroid.  Surgery to remove your thyroid.  Radiation therapy. HOME CARE INSTRUCTIONS   Take medicines only as directed by your health care provider.  Do not use any  tobacco products, including cigarettes, chewing tobacco, or electronic cigarettes. If you need help quitting, ask your health care provider.  Do not exercise or do physical activity until your health care provider approves.  Keep all follow-up appointments as directed by your health care provider. This is important. SEEK MEDICAL CARE IF:  Your symptoms do not get better with treatment.  You have fever.  You are taking thyroid replacement medicine and you:  Have depression.  Feel mentally and physically slow.  Have weight gain. SEEK IMMEDIATE MEDICAL CARE IF:   You have decreased alertness or a change in your awareness.  You have abdominal pain.  You feel dizzy.  You have a rapid heartbeat.  You have an irregular heartbeat.   This information is not intended to replace advice given to you by your health care provider. Make sure you discuss any questions you have with your health care provider.   Document Released: 03/15/2005 Document Revised: 04/05/2014 Document Reviewed: 07/31/2013 Elsevier Interactive Patient Education [employer]2016 Elsevier Inc.

## 2015-06-07 LAB — T3, FREE: T3, Free: 27.2 pg/mL — ABNORMAL HIGH (ref 2.0–4.4)

## 2015-06-10 ENCOUNTER — Ambulatory Visit: Payer: Medicaid Other | Admitting: Endocrinology

## 2015-06-11 ENCOUNTER — Telehealth: Payer: Self-pay | Admitting: Endocrinology

## 2015-06-11 DIAGNOSIS — E0590 Thyrotoxicosis, unspecified without thyrotoxic crisis or storm: Secondary | ICD-10-CM

## 2015-06-11 NOTE — Telephone Encounter (Signed)
Patient no showed today's appt. Please advise on how to follow up. °A. No follow up necessary. °B. Follow up urgent. Contact patient immediately. °C. Follow up necessary. Contact patient and schedule visit in ___ days. °D. Follow up advised. Contact patient and schedule visit in ____weeks. ° °

## 2015-06-11 NOTE — Telephone Encounter (Signed)
Please read and advise Tara Wallace.

## 2015-06-12 NOTE — Congregational Nurse Program (Signed)
Congregational Nurse Program Note  Date of Encounter: 06/11/2015  Past Medical History: Past Medical History  Diagnosis Date  . Graves disease     Encounter Details:     CNP Questionnaire - 06/12/15 0002    Patient Demographics   Is this a new or existing patient? Existing   Patient is considered a/an Not Applicable   Race African-American/Black   Patient Assistance   Location of Patient Assistance Not Applicable   Patient's financial/insurance status Medicaid   Uninsured Patient No   Patient referred to apply for the following financial assistance Medicaid   Food insecurities addressed Not Applicable   Transportation assistance No   Assistance securing medications No   Educational health offerings Chronic disease;Safety   Encounter Details   Primary purpose of visit Education/Health Concerns;Spiritual Care/Support Visit   Was an Emergency Department visit averted? Not Applicable   Does patient have a medical provider? No   Patient referred to Establish PCP;Follow up with established PCP   Was a mental health screening completed? (GAINS tool) No   Does patient have dental issues? No   Does patient have vision issues? No   Since previous encounter, have you referred patient for abnormal blood pressure that resulted in a new diagnosis or medication change? No   Since previous encounter, have you referred patient for abnormal blood glucose that resulted in a new diagnosis or medication change? No   For Abstraction Use Only   Does patient have insurance? Yes     Client was ask to  Follow  Up  With  Nurse  And  Was sent by  Case manager. Client in with  Her  3 children. Ask if she  Followed  Through  With last weeks  Goals to  Make an appointment for PCP, go to DSS have  Medicaid  Card  [place] . Client  States she didn't have time was sick, for several days . States  She was seen in the  ED for  Rapid  Heart beats  And was referred to an endocrinology  But  didn't keep that appointment and didn't  keep it and doesn't  Know  Where the appointment was or have the number to call back. Nurse will  Try  To  Identify where appointment was and get back with client so  she  can reschedule . Nurse  stressed   To client the importance of  getting  care for her  hyperthyroidism  And the dangers of no care.

## 2015-06-15 NOTE — Progress Notes (Signed)
   Subjective:    Patient ID: Tara BraverJessica Wallace, female    DOB: 1985-05-09, 30 y.o.   MRN: [id number]  HPI    Review of Systems     Objective:   Physical Exam        Assessment & Plan:   This encounter was created in error - please disregard.

## 2015-06-16 ENCOUNTER — Encounter: Payer: Medicaid Other | Admitting: Endocrinology

## 2015-06-17 ENCOUNTER — Encounter: Payer: Self-pay | Admitting: Endocrinology

## 2015-06-17 ENCOUNTER — Ambulatory Visit (INDEPENDENT_AMBULATORY_CARE_PROVIDER_SITE_OTHER): Payer: Medicaid Other | Admitting: Endocrinology

## 2015-06-17 VITALS — BP 126/62 | HR 112 | Temp 98.0°F | Ht 65.0 in | Wt 166.0 lb

## 2015-06-17 DIAGNOSIS — E0590 Thyrotoxicosis, unspecified without thyrotoxic crisis or storm: Secondary | ICD-10-CM | POA: Diagnosis not present

## 2015-06-17 MED ORDER — METOPROLOL SUCCINATE ER 100 MG PO TB24: 100.0000 mg | ORAL_TABLET | Freq: Every day | ORAL | Status: AC

## 2015-06-17 NOTE — Patient Instructions (Addendum)
In view of your medical condition, you should avoid pregnancy until we have decided it is safe.  if ever you have fever while taking methimazole, stop it and call us, even if the reason is obvious, because of the risk of a rare side-effect.  blood tests are requested for you today.  We'll let you know about the results.   Please change the propranolol to metoprolol.  i have sent a prescription to your pharmacy.  please call [phone] ([facility] provider referral line), to get an appointment with a new primary provider.   Please come back for a follow-up appointment in 2 weeks.   Please see a surgery specialist.  you will receive a phone call, about a day and time for an appointment.

## 2015-06-17 NOTE — Progress Notes (Signed)
Subjective:    Patient ID: Tara BraverJessica Wallace, female    DOB: 06/19/1985, 30 y.o.   MRN: [id number]  HPI Pt reports he was dx'ed with hyperthyroidism in 2015.  She was rx'ed tapazole.  She was seen in ER 11 days ago, with severe tremor of the hands, and assoc palpitations.  She says she had not missed the tapazole.  she has never had XRT to the anterior neck, or thyroid surgery.  she has never had thyroid imaging.  she does not consume kelp or any other non-prescribed thyroid medication.  she has never been on amiodarone.  She says she is at risk for pregnancy.   Past Medical History  Diagnosis Date  . Graves disease     Past Surgical History  Procedure Laterality Date  . Ectopic pregnancy surgery    . Nose surgery    . Wrist surgery      Social History   Social History  . Marital Status: Single    Spouse Name: N/A  . Number of Children: N/A  . Years of Education: N/A   Occupational History  . Not on file.   Social History Main Topics  . Smoking status: Former Games developermoker  . Smokeless tobacco: Not on file  . Alcohol Use: Yes     Comment: occasionally   . Drug Use: No  . Sexual Activity: Not on file   Other Topics Concern  . Not on file   Social History Narrative    Current Outpatient Prescriptions on File Prior to Visit  Medication Sig Dispense Refill  . doxycycline (VIBRAMYCIN) 100 MG capsule Take 1 capsule (100 mg total) by mouth 2 (two) times daily. 28 capsule 0  . methimazole (TAPAZOLE) 10 MG tablet Take 4 tablets (40 mg total) by mouth 2 (two) times daily. 30 tablet 0  . ALPRAZolam (XANAX) 0.5 MG tablet Take 0.5 mg by mouth 2 (two) times daily as needed for anxiety. Reported on 06/17/2015     No current facility-administered medications on file prior to visit.      Family History  Problem Relation Age of Onset  . Thyroid disease Neg Hx     BP 126/62 mmHg  Pulse 112  Temp(Src) 98 F (36.7 C) (Oral)  Ht 5\' 5"  (1.651 m)  Wt 166 lb (75.297 kg)  BMI 27.62 kg/m2   SpO2 97%  LMP 06/04/2015  Review of Systems denies hoarseness, visual loss, chest pain, and rhinorrhea.  She has lost 40 lbs recently.  She has headache, diarrhea, urinary frequency, muscle weakness, excessive diaphoresis, heat intolerance, anxiety, easy bruising, hair loss, and doe.  She has a slight sore throat.      Objective:   Physical Exam VS: see vs page GEN: no distress HEAD: head: no deformity eyes: no periorbital swelling, no proptosis external nose and ears are normal mouth: no lesion seen NECK: thyroid is 2-3 times normal size, with slightly irreg surface.  No palpable nodule.   CHEST WALL: no deformity LUNGS:  Clear to auscultation.   CV: tachycardic rate but normal rhythm, no murmur ABD: abdomen is soft, nontender.  no hepatosplenomegaly.  not distended.  no hernia MUSCULOSKELETAL: muscle bulk and strength are grossly normal.  no obvious joint swelling.  gait is normal and steady EXTEMITIES: no deformity.  no edema PULSES: no carotid bruit NEURO:  cn 2-12 grossly intact.   readily moves all 4's.  sensation is intact to touch on all 4's. Moderate coarse tremor of the hands. SKIN:  Normal  texture and temperature.  No rash or suspicious lesion is visible.   NODES:  None palpable at the neck PSYCH: alert, well-oriented.  Does not appear anxious nor depressed.   Lab Results  Component Value Date   TSH 0.01* 06/17/2015   i personally reviewed electrocardiogram tracing (06/06/15): Indication:  Impression: ST    Assessment & Plan:  Hyperthyroidism, new to me: persistent despite rx.  Persistence despite prescribed rx is evidence of noncompliance.  Surgery is her best option.  Patient is advised the following: Patient Instructions  In view of your medical condition, you should avoid pregnancy until we have decided it is safe.  if ever you have fever while taking methimazole, stop it and call us, even if the reason is obvious, because of the risk of a rare side-effect.    blood tests are requested for you today.  We'll let you know about the results.   Please change the propranolol to metoprolol.  i have sent a prescription to your pharmacy.  please call [phone] ([facility] provider referral line), to get an appointment with a new primary provider.   Please come back for a follow-up appointment in 2 weeks.   Please see a surgery specialist.  you will receive a phone call, about a day and time for an appointment.

## 2015-06-18 LAB — CBC WITH DIFFERENTIAL/PLATELET
BASOS PCT: 0.5 % (ref 0.0–3.0)
Basophils Absolute: 0 10*3/uL (ref 0.0–0.1)
EOS ABS: 0.1 10*3/uL (ref 0.0–0.7)
Eosinophils Relative: 1.3 % (ref 0.0–5.0)
HCT: 35 % — ABNORMAL LOW (ref 36.0–46.0)
HEMOGLOBIN: 11.9 g/dL — AB (ref 12.0–15.0)
LYMPHS ABS: 2.3 10*3/uL (ref 0.7–4.0)
Lymphocytes Relative: 44.6 % (ref 12.0–46.0)
MCHC: 33.9 g/dL (ref 30.0–36.0)
MCV: 79.7 fl (ref 78.0–100.0)
MONO ABS: 0.6 10*3/uL (ref 0.1–1.0)
Monocytes Relative: 11.8 % (ref 3.0–12.0)
NEUTROS PCT: 41.8 % — AB (ref 43.0–77.0)
Neutro Abs: 2.1 10*3/uL (ref 1.4–7.7)
PLATELETS: 218 10*3/uL (ref 150.0–400.0)
RBC: 4.39 Mil/uL (ref 3.87–5.11)
RDW: 12.4 % (ref 11.5–15.5)
WBC: 5.1 10*3/uL (ref 4.0–10.5)

## 2015-06-18 LAB — T4, FREE: Free T4: 4.75 ng/dL — ABNORMAL HIGH (ref 0.60–1.60)

## 2015-06-18 LAB — TSH: TSH: 0.01 u[IU]/mL — ABNORMAL LOW (ref 0.35–4.50)

## 2015-06-26 ENCOUNTER — Telehealth: Payer: Self-pay | Admitting: Endocrinology

## 2015-06-26 NOTE — Telephone Encounter (Signed)
Tara Wallace, a Child psychotherapistsocial worker for [facility] called about the pt, she said she got sick while she was down there and was admitted to them and she is just trying to find out more information. CB# [phone]

## 2015-06-26 NOTE — Telephone Encounter (Signed)
I left a vm requesting a cal back to discuss.

## 2015-06-27 ENCOUNTER — Telehealth: Payer: Self-pay | Admitting: Endocrinology

## 2015-06-27 NOTE — Telephone Encounter (Signed)
Patient ask you to call as soon as possible. (Same situation from yesterday)

## 2015-06-27 NOTE — Telephone Encounter (Signed)
Judeth CornfieldStephanie is handling this message with Dr. Everardo AllEllison.

## 2015-06-27 NOTE — Telephone Encounter (Signed)
pt called the office multiple times this am, saying [facility] and is possibly being discharged today.  i took her call. She says she wants me to have her transferred to hospital here. i told her i would be neither the sending nor receiving provider, so I could not do that. i advised her to follow the instructions of drs there, and return to [place]. She says she does not have transportation.  i told her i could not help her with that. She has appt here next week. i told her even if she is discharged from hospital, and [place] [facility], and feels she should be back in the hospital, go to ER.

## 2015-06-29 NOTE — Progress Notes (Signed)
   Subjective:    Patient ID: Tara Wallace, female    DOB: February 10, 1986, 30 y.o.   MRN: [id number]  HPI Pt reports he was dx'ed with hyperthyroidism in 2015.  She was rx'ed tapazole.  She was seen in ER 11 days ago, with severe tremor of the hands, and assoc palpitations.  She says she had not missed the tapazole.  she has never had XRT to the anterior neck, or thyroid surgery.  she has never had thyroid imaging.  she does not consume kelp or any other non-prescribed thyroid medication.  she has never been on amiodarone.  She says she is at risk for pregnancy.     Review of Systems     Objective:   Physical Exam        Assessment & Plan:   This encounter was created in error - please disregard.

## 2015-06-29 NOTE — Patient Instructions (Signed)
In view of your medical condition, you should avoid pregnancy until we have decided it is safe.  if ever you have fever while taking methimazole, stop it and call us, even if the reason is obvious, because of the risk of a rare side-effect.  blood tests are requested for you today.  We'll let you know about the results.   Please change the propranolol to metoprolol.  i have sent a prescription to your pharmacy.  please call [phone] ([facility] provider referral line), to get an appointment with a new primary provider.   Please come back for a follow-up appointment in 2 weeks.   Please see a surgery specialist.  you will receive a phone call, about a day and time for an appointment.

## 2015-06-30 ENCOUNTER — Encounter: Payer: Medicaid Other | Admitting: Endocrinology

## 2015-06-30 DIAGNOSIS — Z0289 Encounter for other administrative examinations: Secondary | ICD-10-CM

## 2015-07-01 ENCOUNTER — Telehealth: Payer: Self-pay | Admitting: Endocrinology

## 2015-07-01 NOTE — Telephone Encounter (Signed)
Pt scheduled for tomorrow at 415 pm.

## 2015-07-01 NOTE — Telephone Encounter (Signed)
See note below and please advise, Thanks! 

## 2015-07-01 NOTE — Telephone Encounter (Signed)
Pt called and said she "can't walk" I informed her of her missed appointment yesterday and she said well I can't walk so I don't know what I am supposed to do.  I told her Dr. Everardo AllEllison wants to see her so to try to come in to the office if she can get some assistance, she argued with me and said she needs to talk to Dr. Everardo AllEllison personally.  I told her I would take a message and send to him.

## 2015-07-01 NOTE — Telephone Encounter (Signed)
i called this patient last week, but i was unsuccessful getting her to accept medical device. Please advise ov.

## 2015-07-02 ENCOUNTER — Ambulatory Visit: Payer: Medicaid Other | Admitting: Endocrinology

## 2015-07-09 ENCOUNTER — Telehealth: Payer: Self-pay

## 2015-07-09 ENCOUNTER — Telehealth: Payer: Self-pay | Admitting: Endocrinology

## 2015-07-09 DIAGNOSIS — E0590 Thyrotoxicosis, unspecified without thyrotoxic crisis or storm: Secondary | ICD-10-CM

## 2015-07-09 NOTE — Telephone Encounter (Signed)
Client  Did not  Keep  Her  Hospital  Discharge  Appointment  Last  Week ,states she  Didn't feel  Good  So  She didn't go . Now  Needs her prescriptions  Filled  Will have to see MD. Nurse scheduled appointment while  Client was here for 07-16-15 @ 7:45 am. Counseled  Regarding importance of  keeling  Follow  Up appointment. . Ask if  MD  Could  Call in medication for anxiety  Xanax  Will call it in . Client  Discharged herself  From hospital felt  She  Was being  Given to  Much  Medication per client..  Client  Not  Very  Helpful  With information and is  Very  Non compliant as the  Nurse  Has  Tried to offer assitance  Previously. Ask tha tshe  Follow  Up  With  Nurse next next week  After her MD appointment.

## 2015-07-09 NOTE — Telephone Encounter (Signed)
I contacted the pt and advised this refill would need to need to come from her PCP. Pt stated she did not have a PCP at this time, but would get established with one and soon as she can.

## 2015-07-09 NOTE — Congregational Nurse Program (Signed)
Congregational Nurse Program Note  Date of Encounter: 07/09/2015  Past Medical History: Past Medical History  Diagnosis Date  . Graves disease     Encounter Details:     CNP Questionnaire - 07/09/15 1731    Patient Demographics   Is this a new or existing patient? Existing   Patient is considered a/an Not Applicable   Race African-American/Black   Patient Assistance   Location of Patient Assistance Not Applicable   Patient's financial/insurance status Medicaid   Uninsured Patient No   Patient referred to apply for the following financial assistance Not Applicable   Food insecurities addressed Not Applicable   Transportation assistance No   Assistance securing medications Yes   Type of Assistance Other   Educational health offerings Chronic disease;Safety   Encounter Details   Primary purpose of visit Education/Health Concerns;Spiritual Care/Support Visit;Navigating the Healthcare System;Post ED/Hospitalization Visit   Was an Emergency Department visit averted? Not Applicable   Does patient have a medical provider? Yes   Patient referred to Establish PCP;Follow up with established PCP   Was a mental health screening completed? (GAINS tool) No   Does patient have dental issues? No   Does patient have vision issues? No   Since previous encounter, have you referred patient for abnormal blood pressure that resulted in a new diagnosis or medication change? No   Since previous encounter, have you referred patient for abnormal blood glucose that resulted in a new diagnosis or medication change? No   For Abstraction Use Only   Does patient have insurance? Yes    Client  Was to  Come  By  And  See the  Nurse  Past  Her  2 week discharge  From  Hospital. Client  In today  After talking with case  Manager and needs  Help  Securing  Her medications. Client in states she is  Out of her Xanax  For her panic/ anxiety disorder. States she has panic  Attacks  Before her  Heart palpations and  with  Her  Heart  Racing  She  Has even more of a need for her med's. Nurse questioned  Client  Regarding last  Ref[place] it  [place]. Will need a   order to  Get  Medications filled. Ask if  client during her hospitalization  Was given any  Prescriptions  She  States  Maybe  She  Lost her D/C papers and did not  Go in for her follow  Up appointment. . Nurse secured information ,called her  MD  To  Get  Her  Rescheduled , appointment made for  07-16-2015,@ 7:45 am , Dr.  Anson FretShawn  Ellis , endocrinologist .   States  While  Hospitalized  She  Was on  To  Much  Medication, couldn't walk , doesn't  Know  Why  She  Couldn't  Walk,testing  Came back  Normal per  Client. MD  Office  To  [facility]  And  Client  States  She will pick it up.  Nurse  Tried to  Talk with  Client about  Her  Diagnosis  And  Explain that  She  Needs to  Follow  Up  With her care. . Ask  Client  To  Come  Back  Next  Week for a follow up  Appointment. Ask if her feet where still swollen  She  Stated  Some ,nurse was able to look at a ankles  Some ,client not  Very  Co=operative  , little  swelling  Noted that  Client talked  About  Care when she  Lived in [place]

## 2015-07-09 NOTE — Telephone Encounter (Signed)
Patient need a refill of medication  ALPRAZolam (XANAX) 0.5 MG tablet, send to  [facility] [id number] - [place], [place] - [address] AT [facility] [address] [facility] [phone] (Phone) [phone] (Fax)

## 2015-07-15 DIAGNOSIS — Z9119 Patient's noncompliance with other medical treatment and regimen: Secondary | ICD-10-CM

## 2015-07-15 DIAGNOSIS — Z91199 Patient's noncompliance with other medical treatment and regimen due to unspecified reason: Secondary | ICD-10-CM

## 2015-07-15 DIAGNOSIS — E0590 Thyrotoxicosis, unspecified without thyrotoxic crisis or storm: Secondary | ICD-10-CM

## 2015-07-15 NOTE — Congregational Nurse Program (Signed)
Congregational Nurse Program Note  Date of Encounter: 07/15/2015  Past Medical History: Past Medical History  Diagnosis Date  . Graves disease     Encounter Details:     CNP Questionnaire - 07/15/15 2231    Patient Demographics   Is this a new or existing patient? Existing   Patient is considered a/an Not Applicable   Race African-American/Black   Patient Assistance   Location of Patient Assistance Not Applicable   Patient's financial/insurance status Medicaid   Uninsured Patient No   Patient referred to apply for the following financial assistance Not Applicable   Food insecurities addressed Not Applicable   Transportation assistance No   Assistance securing medications No   Educational health offerings Chronic disease;Navigating the healthcare system;Medications   Encounter Details   Primary purpose of visit Education/Health Concerns;Chronic Illness/Condition Visit   Was an Emergency Department visit averted? Not Applicable   Does patient have a medical provider? Yes   Patient referred to Establish PCP;Area Agency;Follow up with established PCP   Was a mental health screening completed? (GAINS tool) No   Does patient have dental issues? No   Does patient have vision issues? No   Since previous encounter, have you referred patient for abnormal blood pressure that resulted in a new diagnosis or medication change? No   Since previous encounter, have you referred patient for abnormal blood glucose that resulted in a new diagnosis or medication change? No   For Abstraction Use Only   Does patient have insurance? Yes    Nurse has tried to assist client with getting her speciality care and connecting with a primary care provider. She returns to see the nurse on her terms often states  She lost  Phone  Numbers didn't keep her follow  Up appointments for various reasons.Nurse has discussed the importance of her care and follow up and the dangers of not taking care of her condition.  When nurse saw client in the hallway  She  Had  Not  Called to  Make appointment with PCP. Reminded today of  Appointment with endocrinologist on tomorrow  And gave the client several PCP that  Are taking new clients  For her to call and get an appointment for  Primary care. And dental care. Highlighted  Practices . Follow up next week to see if  Client has kept appointment and followed up  To get a PCP.

## 2015-07-16 ENCOUNTER — Ambulatory Visit: Payer: Medicaid Other | Admitting: Endocrinology

## 2015-07-30 ENCOUNTER — Telehealth: Payer: Self-pay | Admitting: Endocrinology

## 2015-07-30 NOTE — Telephone Encounter (Signed)
Pt is asking if we can write her a letter for being in the [place], I informed the pt that [facility] [address] [place] and she needs to call them. She needs the letter because when she was in the hospital she missed her court date and needs a letter showing she was not in the state.

## 2015-07-31 NOTE — Telephone Encounter (Signed)
Pt.notified

## 2015-07-31 NOTE — Telephone Encounter (Signed)
i can't certify this any more than pt can. [facility]

## 2015-07-31 NOTE — Telephone Encounter (Signed)
See note below to be advised. 

## 2015-08-08 ENCOUNTER — Ambulatory Visit: Payer: Medicaid Other | Admitting: Endocrinology

## 2015-08-08 DIAGNOSIS — Z0289 Encounter for other administrative examinations: Secondary | ICD-10-CM

## 2015-08-13 NOTE — Congregational Nurse Program (Signed)
Congregational Nurse Program Note  Date of Encounter: 07/15/2015  Past Medical History: Past Medical History  Diagnosis Date  . Graves disease     Encounter Details:     CNP Questionnaire - 08/13/15 1654    Patient Demographics   Is this a new or existing patient? Existing   Patient is considered a/an Not Applicable   Race African-American/Black   Patient Assistance   Location of Patient Assistance Not Applicable   Patient's financial/insurance status Medicaid   Uninsured Patient No   Patient referred to apply for the following financial assistance Not Applicable   Food insecurities addressed Not Applicable   Transportation assistance No   Assistance securing medications No   Educational health offerings Chronic disease;Navigating the healthcare system   Encounter Details   Primary purpose of visit Education/Health Concerns;Chronic Illness/Condition Visit   Was an Emergency Department visit averted? Not Applicable   Does patient have a medical provider? Yes   Patient referred to Establish PCP;Area Agency;Follow up with established PCP   Was a mental health screening completed? (GAINS tool) No   Does patient have dental issues? No   Does patient have vision issues? No   Does your patient have an abnormal blood pressure today? No   Since previous encounter, have you referred patient for abnormal blood pressure that resulted in a new diagnosis or medication change? No   Does your patient have an abnormal blood glucose today? No   Since previous encounter, have you referred patient for abnormal blood glucose that resulted in a new diagnosis or medication change? No   Was there a life-saving intervention made? No   For Abstraction Use Only   Was the patient insured? Yes    Nurse  Stopped  Client in hallway to check on follow  Up with obtaining  PCP,states  She lost  The number ,nurse  Gave the  Client the numbers again of practices ntaking new patients and encouraged her to  call. Has appointment next with to follow up with endo-MD reminded of that appointment.

## 2015-10-27 ENCOUNTER — Emergency Department (HOSPITAL_COMMUNITY): Payer: Medicaid Other

## 2015-10-27 ENCOUNTER — Emergency Department (HOSPITAL_COMMUNITY)
Admission: EM | Admit: 2015-10-27 | Discharge: 2015-10-27 | Disposition: A | Discharge status: Home / Self Care | Payer: Medicaid Other | Attending: Emergency Medicine | Admitting: Emergency Medicine

## 2015-10-27 ENCOUNTER — Encounter (HOSPITAL_COMMUNITY): Payer: Self-pay | Admitting: Nurse Practitioner

## 2015-10-27 DIAGNOSIS — E0590 Thyrotoxicosis, unspecified without thyrotoxic crisis or storm: Secondary | ICD-10-CM | POA: Diagnosis not present

## 2015-10-27 DIAGNOSIS — Z87891 Personal history of nicotine dependence: Secondary | ICD-10-CM | POA: Diagnosis not present

## 2015-10-27 DIAGNOSIS — R55 Syncope and collapse: Secondary | ICD-10-CM | POA: Diagnosis present

## 2015-10-27 LAB — BASIC METABOLIC PANEL
Anion gap: 8 (ref 5–15)
BUN: 6 mg/dL (ref 6–20)
CO2: 29 mmol/L (ref 22–32)
Calcium: 9.6 mg/dL (ref 8.9–10.3)
Chloride: 101 mmol/L (ref 101–111)
Creatinine, Ser: 0.45 mg/dL (ref 0.44–1.00)
GFR calc Af Amer: >60 mL/min (ref >60)
GFR calc non Af Amer: >60 mL/min (ref >60)
Glucose, Bld: 109 mg/dL — ABNORMAL HIGH (ref 65–99)
Potassium: 3.8 mmol/L (ref 3.5–5.1)
Sodium: 138 mmol/L (ref 135–145)

## 2015-10-27 LAB — CBC
HCT: 41 % (ref 36.0–46.0)
Hemoglobin: 13.6 g/dL (ref 12.0–15.0)
MCH: 27.7 pg (ref 26.0–34.0)
MCHC: 33.2 g/dL (ref 30.0–36.0)
MCV: 83.5 fL (ref 78.0–100.0)
Platelets: 268 10*3/uL (ref 150–400)
RBC: 4.91 MIL/uL (ref 3.87–5.11)
RDW: 12 % (ref 11.5–15.5)
WBC: 5.4 10*3/uL (ref 4.0–10.5)

## 2015-10-27 LAB — I-STAT TROPONIN, ED: Troponin i, poc: 0.01 ng/mL (ref 0.00–0.08)

## 2015-10-27 NOTE — ED Provider Notes (Addendum)
Pt with multiple complaints. I can find some of her frustration understandable, but I also feel that much of it is misplaced. She seems more concerned with assigning blame for her medical problems than she does in being proactive in finding a solution. I can't fix that. I spent an extended period of time listening to her and acknowledging her concerns. It was readily apparent that she wasn't going to be satisfied with any answers I gave her.  I recommended that she be compliant with her medications, follow-up with another endocrinologist if she is not comfortable with previous ones, will give her surgery contact information because she feels that the answer is thyroidectomy and I also recommended counseling for what seems to be poor coping skills and impulse control.    Raeford Razor, MD 10/27/15 1715   Medical screening examination/treatment/procedure(s) were performed by non-physician practitioner and as supervising physician I was immediately available for consultation/collaboration.   EKG Interpretation None        Raeford Razor, MD 10/27/15 [phone]
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
## 2016-02-05 NOTE — Congregational Nurse Program (Signed)
Congregational Nurse Program Note  Date of Encounter: 01/01/2016  Past Medical History: Past Medical History:  Diagnosis Date  . Graves disease     Encounter Details:   TC to Ms. Randa EvensEdwards, SW at [employer].  Reports children often late and mother doesn't appear to care.  Mother has been irritated and noat compliant with getting kids to school on time.  Kids have been out a couple of days.  SW agreed to call Mom to see if she can be of any help.

## 2016-02-06 NOTE — Congregational Nurse Program (Signed)
Congregational Nurse Program Note  Date of Encounter: 01/15/2016  Past Medical History: Past Medical History:  Diagnosis Date  . Graves disease     Encounter Details:     CNP Questionnaire - 01/15/16 0208      Patient Demographics   Is this a new or existing patient? Existing   Patient is considered a/an Not Applicable   Race African-American/Black     Patient Assistance   [facility]   Patient's financial/insurance status Medicaid   Uninsured Patient ([place] Card/Care Connects) No   Patient referred to apply for the following financial assistance Not Applicable   Food insecurities addressed Not Applicable   Transportation assistance No   Assistance securing medications No   Educational health offerings Chronic disease;Navigating the healthcare system;Behavioral health;Interpersonal relationships;Safety     Encounter Details   Primary purpose of visit Education/Health Concerns;Chronic Illness/Condition Visit;Other  social support   Was an Emergency Department visit averted? Not Applicable   Does patient have a medical provider? Yes   Patient referred to Establish PCP;Area Agency;Follow up with established PCP   Was a mental health screening completed? (GAINS tool) No   Does patient have dental issues? No   Does patient have vision issues? No   Does your patient have an abnormal blood pressure today? No   Since previous encounter, have you referred patient for abnormal blood pressure that resulted in a new diagnosis or medication change? No   Does your patient have an abnormal blood glucose today? No   Since previous encounter, have you referred patient for abnormal blood glucose that resulted in a new diagnosis or medication change? No   Was there a life-saving intervention made? No      [facility]

## 2016-03-28 NOTE — Congregational Nurse Program (Signed)
Congregational Nurse Program Note  Date of Encounter: 03/04/2016  Past Medical History: Past Medical History:  Diagnosis Date  . Graves disease     Encounter Details:     CNP Questionnaire - 03/04/16 2332      Patient Demographics   Is this a new or existing patient? Existing   Patient is considered a/an Not Applicable   Race African-American/Black     Patient Assistance   [facility]   Patient's financial/insurance status Medicaid   Uninsured Patient ([place] Card/Care Connects) No   Patient referred to apply for the following financial assistance Not Applicable   Food insecurities addressed Not Applicable   Transportation assistance No   Assistance securing medications No   Educational health offerings Chronic disease;Navigating the healthcare system;Behavioral health;Interpersonal relationships;Safety     Encounter Details   Primary purpose of visit Education/Health Concerns;Chronic Illness/Condition Visit;Other  social support   Was an Emergency Department visit averted? Not Applicable   Does patient have a medical provider? Yes   Patient referred to Establish PCP;Area Agency;Follow up with established PCP   Was a mental health screening completed? (GAINS tool) No   Does patient have dental issues? No   Does patient have vision issues? No   Does your patient have an abnormal blood pressure today? No   Since previous encounter, have you referred patient for abnormal blood pressure that resulted in a new diagnosis or medication change? No   Does your patient have an abnormal blood glucose today? No   Since previous encounter, have you referred patient for abnormal blood glucose that resulted in a new diagnosis or medication change? No   Was there a life-saving intervention made? No    Brief encounter with client.  Still [facility].  Encouraged to call to make appt.

## 2016-03-31 NOTE — Congregational Nurse Program (Signed)
Congregational Nurse Program Note  Date of Encounter: 03/11/2016  Past Medical History: Past Medical History:  Diagnosis Date  . Graves disease     Encounter Details:     CNP Questionnaire - 03/11/16 0117      Patient Demographics   Is this a new or existing patient? Existing   Patient is considered a/an Not Applicable   Race African-American/Black     Patient Assistance   [facility]   Patient's financial/insurance status Medicaid   Uninsured Patient ([place] Card/Care Connects) No   Patient referred to apply for the following financial assistance Not Applicable   Food insecurities addressed Not Applicable   Transportation assistance No   Assistance securing medications No   Educational health offerings Chronic disease;Navigating the healthcare system;Behavioral health;Interpersonal relationships;Safety     Encounter Details   Primary purpose of visit Education/Health Concerns;Chronic Illness/Condition Visit;Other  social support   Was an Emergency Department visit averted? Not Applicable   Does patient have a medical provider? Yes   Patient referred to Establish PCP;Area Agency;Follow up with established PCP   Was a mental health screening completed? (GAINS tool) No   Does patient have dental issues? No   Does patient have vision issues? No   Does your patient have an abnormal blood pressure today? No   Since previous encounter, have you referred patient for abnormal blood pressure that resulted in a new diagnosis or medication change? No   Does your patient have an abnormal blood glucose today? No   Since previous encounter, have you referred patient for abnormal blood glucose that resulted in a new diagnosis or medication change? No   Was there a life-saving intervention made? No    Client reports she has still not [facility].  [facility]  She agreed to call

## 2016-06-03 ENCOUNTER — Encounter (HOSPITAL_COMMUNITY): Payer: Self-pay

## 2016-06-03 ENCOUNTER — Emergency Department (HOSPITAL_COMMUNITY)
Admission: EM | Admit: 2016-06-03 | Discharge: 2016-06-03 | Disposition: A | Discharge status: Home / Self Care | Payer: Medicaid Other | Attending: Emergency Medicine | Admitting: Emergency Medicine

## 2016-06-03 ENCOUNTER — Emergency Department (HOSPITAL_COMMUNITY): Payer: Medicaid Other

## 2016-06-03 DIAGNOSIS — N83292 Other ovarian cyst, left side: Secondary | ICD-10-CM | POA: Insufficient documentation

## 2016-06-03 DIAGNOSIS — A599 Trichomoniasis, unspecified: Secondary | ICD-10-CM | POA: Diagnosis not present

## 2016-06-03 DIAGNOSIS — N730 Acute parametritis and pelvic cellulitis: Secondary | ICD-10-CM

## 2016-06-03 DIAGNOSIS — Z87891 Personal history of nicotine dependence: Secondary | ICD-10-CM | POA: Diagnosis not present

## 2016-06-03 DIAGNOSIS — N739 Female pelvic inflammatory disease, unspecified: Secondary | ICD-10-CM | POA: Insufficient documentation

## 2016-06-03 DIAGNOSIS — R1032 Left lower quadrant pain: Secondary | ICD-10-CM | POA: Diagnosis present

## 2016-06-03 DIAGNOSIS — N83202 Unspecified ovarian cyst, left side: Secondary | ICD-10-CM

## 2016-06-03 LAB — COMPREHENSIVE METABOLIC PANEL
ALK PHOS: 94 U/L (ref 38–126)
ALT: 15 U/L (ref 14–54)
AST: 19 U/L (ref 15–41)
Albumin: 3.9 g/dL (ref 3.5–5.0)
Anion gap: 7 (ref 5–15)
BUN: 9 mg/dL (ref 6–20)
CALCIUM: 8.9 mg/dL (ref 8.9–10.3)
CO2: 31 mmol/L (ref 22–32)
CREATININE: 0.57 mg/dL (ref 0.44–1.00)
Chloride: 103 mmol/L (ref 101–111)
GFR calc non Af Amer: >60 mL/min (ref >60)
Glucose, Bld: 71 mg/dL (ref 65–99)
Potassium: 3.4 mmol/L — ABNORMAL LOW (ref 3.5–5.1)
Sodium: 141 mmol/L (ref 135–145)
Total Bilirubin: 0.7 mg/dL (ref 0.3–1.2)
Total Protein: 6.9 g/dL (ref 6.5–8.1)

## 2016-06-03 LAB — URINALYSIS, ROUTINE W REFLEX MICROSCOPIC
Bilirubin Urine: NEGATIVE
GLUCOSE, UA: NEGATIVE mg/dL
HGB URINE DIPSTICK: NEGATIVE
KETONES UR: NEGATIVE mg/dL
Leukocytes, UA: NEGATIVE
Nitrite: NEGATIVE
PROTEIN: NEGATIVE mg/dL
Specific Gravity, Urine: 1.028 (ref 1.005–1.030)
pH: 6 (ref 5.0–8.0)

## 2016-06-03 LAB — CBC
HCT: 39.9 % (ref 36.0–46.0)
Hemoglobin: 13.7 g/dL (ref 12.0–15.0)
MCH: 28.7 pg (ref 26.0–34.0)
MCHC: 34.3 g/dL (ref 30.0–36.0)
MCV: 83.5 fL (ref 78.0–100.0)
PLATELETS: 313 10*3/uL (ref 150–400)
RBC: 4.78 MIL/uL (ref 3.87–5.11)
RDW: 12.5 % (ref 11.5–15.5)
WBC: 5.2 10*3/uL (ref 4.0–10.5)

## 2016-06-03 LAB — WET PREP, GENITAL
CLUE CELLS WET PREP: NONE SEEN
Sperm: NONE SEEN
Yeast Wet Prep HPF POC: NONE SEEN

## 2016-06-03 LAB — LIPASE, BLOOD: Lipase: 26 U/L (ref 11–51)

## 2016-06-03 LAB — POC URINE PREG, ED: PREG TEST UR: NEGATIVE

## 2016-06-03 MED ORDER — ACETAMINOPHEN 500 MG PO TABS
1000.0000 mg | ORAL_TABLET | Freq: Once | ORAL | Status: AC
  Administered 2016-06-03: 1000 mg via ORAL
  Filled 2016-06-03: qty 2

## 2016-06-03 MED ORDER — METRONIDAZOLE 500 MG PO TABS
2000.0000 mg | ORAL_TABLET | Freq: Once | ORAL | Status: AC
  Administered 2016-06-03: 2000 mg via ORAL
  Filled 2016-06-03: qty 4

## 2016-06-03 MED ORDER — DOXYCYCLINE HYCLATE 100 MG PO CAPS: 100.0000 mg | ORAL_CAPSULE | Freq: Two times a day (BID) | ORAL | 0 refills | Status: AC

## 2016-06-03 NOTE — ED Notes (Signed)
Pelvic cart prepared at bedside

## 2016-06-03 NOTE — ED Notes (Signed)
Patient transported to Ultrasound 

## 2016-06-03 NOTE — ED Provider Notes (Signed)
Received signout from Dr. Rennis ChrisJacobowitz at the beginning of shift. This a [age] year female presenting with left lower abdominal pain with vaginal discharge that felt similar to the prior ectopic pregnancy or pelvic inflammatory disease that she had in the past. Pregnancy test today is negative however her wet prep shows evidence of trichomonas and many WBC which cyst suggestive of PID. Pelvic ultrasound obtained showing evidence of ovarian cyst and left side but no other concerning features such as ovarian torsion or TOA. Patient is allergic to penicillin.  Patient will receive Flagyl 2 g by mouth while in the ER as treatment for Trichomonas. She will be discharge with doxycycline for the next 2 weeks as treatment for PID. Encouraged patient to avoid sexual activities and to notify partner to get tested as well. Return precaution discussed. Patient voiced understanding and agrees with plan. She is currently resting comfortably in no acute discomfort.  BP 120/65 (BP Location: Left Arm)   Pulse 69   Temp 97.8 F (36.6 C) (Oral)   Resp 18   Ht 5\' 7"  (1.702 m)   Wt 81.6 kg   LMP 05/16/2016   SpO2 98%   BMI 28.19 kg/m   Results for orders placed or performed during the hospital encounter of 06/03/16  Wet prep, genital  Result Value Ref Range   Yeast Wet Prep HPF POC NONE SEEN NONE SEEN   Trich, Wet Prep PRESENT (A) NONE SEEN   Clue Cells Wet Prep HPF POC NONE SEEN NONE SEEN   WBC, Wet Prep HPF POC MANY (A) NONE SEEN   Sperm NONE SEEN   Lipase, blood  Result Value Ref Range   Lipase 26 11 - 51 U/L  Comprehensive metabolic panel  Result Value Ref Range   Sodium 141 135 - 145 mmol/L   Potassium 3.4 (L) 3.5 - 5.1 mmol/L   Chloride 103 101 - 111 mmol/L   CO2 31 22 - 32 mmol/L   Glucose, Bld 71 65 - 99 mg/dL   BUN 9 6 - 20 mg/dL   Creatinine, Ser 1.610.57 0.44 - 1.00 mg/dL   Calcium 8.9 8.9 - 09.610.3 mg/dL   Total Protein 6.9 6.5 - 8.1 g/dL   Albumin 3.9 3.5 - 5.0 g/dL   AST 19 15 - 41 U/L   ALT 15  14 - 54 U/L   Alkaline Phosphatase 94 38 - 126 U/L   Total Bilirubin 0.7 0.3 - 1.2 mg/dL   GFR calc non Af Amer >60 >60 mL/min   GFR calc Af Amer >60 >60 mL/min   Anion gap 7 5 - 15  CBC  Result Value Ref Range   WBC 5.2 4.0 - 10.5 K/uL   RBC 4.78 3.87 - 5.11 MIL/uL   Hemoglobin 13.7 12.0 - 15.0 g/dL   HCT 04.539.9 40.936.0 - 81.146.0 %   MCV 83.5 78.0 - 100.0 fL   MCH 28.7 26.0 - 34.0 pg   MCHC 34.3 30.0 - 36.0 g/dL   RDW 91.412.5 78.211.5 - 95.615.5 %   Platelets 313 150 - 400 K/uL  Urinalysis, Routine w reflex microscopic  Result Value Ref Range   Color, Urine YELLOW YELLOW   APPearance CLEAR CLEAR   Specific Gravity, Urine 1.028 1.005 - 1.030   pH 6.0 5.0 - 8.0   Glucose, UA NEGATIVE NEGATIVE mg/dL   Hgb urine dipstick NEGATIVE NEGATIVE   Bilirubin Urine NEGATIVE NEGATIVE   Ketones, ur NEGATIVE NEGATIVE mg/dL   Protein, ur NEGATIVE NEGATIVE mg/dL  Nitrite NEGATIVE NEGATIVE   Leukocytes, UA NEGATIVE NEGATIVE  POC urine preg, ED (not at [facility])  Result Value Ref Range   Preg Test, Ur NEGATIVE NEGATIVE   US Transvaginal Non-ob  Result Date: 06/03/2016 CLINICAL DATA:  Left lower quadrant pain for 2 weeks EXAM: TRANSABDOMINAL AND TRANSVAGINAL ULTRASOUND OF PELVIS TECHNIQUE: Both transabdominal and transvaginal ultrasound examinations of the pelvis were performed. Transabdominal technique was performed for global imaging of the pelvis including uterus, ovaries, adnexal regions, and pelvic cul-de-sac. It was necessary to proceed with endovaginal exam following the transabdominal exam to visualize the endometrium and ovaries. COMPARISON:  None FINDINGS: Uterus Measurements: 6 x 3.3 x 4.7 cm. No fibroids or other mass visualized. Endometrium Thickness: 9.1 mm.  No focal abnormality visualized. Right ovary Measurements: 2.6 x 2.2 x 2.2 cm. Normal appearance/no adnexal mass. Left ovary Measurements: 4.1 x 2.6 x 2.3 cm. Possible involuting cyst or follicle left ovary measuring 1.4 x 1.1 x 1.3 cm. Possible small  hemorrhagic cyst measuring 1.2 x 1 x 1 cm. Other findings Small amount of free fluid in the pelvis. IMPRESSION: 1. Probable 1.4 cm involuting cyst left ovary. Small amount of free fluid in the pelvis. 2. Otherwise negative pelvic ultrasound Electronically Signed   By: Jasmine Pang M.D.   On: 06/03/2016 20:21   US Pelvis Complete  Result Date: 06/03/2016 CLINICAL DATA:  Left lower quadrant pain for 2 weeks EXAM: TRANSABDOMINAL AND TRANSVAGINAL ULTRASOUND OF PELVIS TECHNIQUE: Both transabdominal and transvaginal ultrasound examinations of the pelvis were performed. Transabdominal technique was performed for global imaging of the pelvis including uterus, ovaries, adnexal regions, and pelvic cul-de-sac. It was necessary to proceed with endovaginal exam following the transabdominal exam to visualize the endometrium and ovaries. COMPARISON:  None FINDINGS: Uterus Measurements: 6 x 3.3 x 4.7 cm. No fibroids or other mass visualized. Endometrium Thickness: 9.1 mm.  No focal abnormality visualized. Right ovary Measurements: 2.6 x 2.2 x 2.2 cm. Normal appearance/no adnexal mass. Left ovary Measurements: 4.1 x 2.6 x 2.3 cm. Possible involuting cyst or follicle left ovary measuring 1.4 x 1.1 x 1.3 cm. Possible small hemorrhagic cyst measuring 1.2 x 1 x 1 cm. Other findings Small amount of free fluid in the pelvis. IMPRESSION: 1. Probable 1.4 cm involuting cyst left ovary. Small amount of free fluid in the pelvis. 2. Otherwise negative pelvic ultrasound Electronically Signed   By: Jasmine Pang M.D.   On: 06/03/2016 20:21      Fayrene Helper, PA-C 06/03/16 2040    Canary Brim Tegeler, MD 06/04/16 1052

## 2016-06-03 NOTE — ED Notes (Signed)
Watched over kiddos while patient went to restroom

## 2016-06-03 NOTE — ED Triage Notes (Signed)
Per Pt, Pt reports on-going left lower abdominal pain that started two weeks ago. Pt was reported to have UTI with medication that wasn't effective. Reports slight white discharge vaginal discharge. Denies Urinary symptoms.

## 2016-06-03 NOTE — Discharge Instructions (Signed)
You have been diagnosed with pelvic inflammatory disease.  Please take antibiotic as prescribed for the full duration.  Avoid sexual activities until your condition is completely resolve.  Notify partner to get tested and treated.  You also have ovarian cyst on the left side which may contribute to your pain.  Take ibuprofen or tylenol as needed for pain.

## 2016-06-03 NOTE — ED Notes (Signed)
Pt could not void at this time.  pt provided a specimen cup

## 2016-06-03 NOTE — ED Notes (Signed)
Notified that we need urine

## 2016-06-03 NOTE — ED Provider Notes (Signed)
[facility] Provider Note   CSN: [id number] Arrival date & time: 06/03/16  1502     History   Chief Complaint Chief Complaint  Patient presents with  . Abdominal Pain    HPI Tara Wallace is a 31 y.o. female.  HPI complains of left lower quadrant abdominal pain, nonradiating onset 2 weeks ago. Pain is constant. Nothing makes symptoms better or worse. Associated symptoms include vaginal discharge .Marland Kitchen Pain feels like ectopic pregnancy or pelvic inflammatory disease she's had in the past. She is was treated with an antibiotic one month ago but reports that her urine is still dark. Since treatment with antibiotic vaginal discharge is diminished but still present. No other associated symptoms.Nothing makes symptoms better or worse. Denies fever, nausea or vomiting. Last normal menstrual period approximately 2 weeks ago.   Past Medical History:  Diagnosis Date  . Graves disease     Patient Active Problem List   Diagnosis Date Noted  . Hyperthyroidism 06/17/2015    Past Surgical History:  Procedure Laterality Date  . ECTOPIC PREGNANCY SURGERY    . NOSE SURGERY    . WRIST SURGERY      OB History    No data available       Home Medications    Prior to Admission medications   Medication Sig Start Date End Date Taking? Authorizing Provider  ALPRAZolam Prudy Feeler) 0.5 MG tablet Take 0.5 mg by mouth 2 (two) times daily as needed for anxiety. Reported on 06/17/2015    Historical Provider, MD  doxycycline (VIBRAMYCIN) 100 MG capsule Take 1 capsule (100 mg total) by mouth 2 (two) times daily. 03/10/15   Nicole Pisciotta, PA-C  methimazole (TAPAZOLE) 10 MG tablet Take 4 tablets (40 mg total) by mouth 2 (two) times daily. 06/06/15   Fayrene Helper, PA-C  metoprolol succinate (TOPROL-XL) 100 MG 24 hr tablet Take 1 tablet (100 mg total) by mouth daily. Take with or immediately following a meal. 06/17/15   Romero Belling, MD  propranolol (INDERAL) 80 MG tablet Take 1 tablet by mouth daily.  07/20/15   Historical Provider, MD    Family History Family History  Problem Relation Age of Onset  . Thyroid disease Neg Hx     Social History Social History  Substance Use Topics  . Smoking status: Former Games developer  . Smokeless tobacco: Never Used  . Alcohol use Yes     Comment: occasionally      Allergies   Penicillins   Review of Systems Review of Systems  Constitutional: Negative.   HENT: Negative.   Respiratory: Negative.   Cardiovascular: Negative.   Gastrointestinal: Positive for abdominal pain.  Genitourinary: Positive for vaginal discharge.  Musculoskeletal: Negative.   Skin: Negative.   Neurological: Negative.   Psychiatric/Behavioral: Negative.   All other systems reviewed and are negative.    Physical Exam Updated Vital Signs BP 120/65 (BP Location: Left Arm)   Pulse 69   Temp 97.8 F (36.6 C) (Oral)   Resp 18   Ht 5\' 7"  (1.702 m)   Wt 180 lb (81.6 kg)   LMP 05/16/2016   SpO2 98%   BMI 28.19 kg/m   Physical Exam  Constitutional: She appears well-developed and well-nourished. No distress.  HENT:  Head: Normocephalic and atraumatic.  Right Ear: External ear normal.  Left Ear: External ear normal.  Eyes: Conjunctivae are normal. Pupils are equal, round, and reactive to light.  Neck: Neck supple. No tracheal deviation present. No thyromegaly present.  Cardiovascular: Normal rate  and regular rhythm.   No murmur heard. Pulmonary/Chest: Effort normal and breath sounds normal.  Abdominal: Soft. Bowel sounds are normal. She exhibits no distension and no mass. There is tenderness. There is no rebound and no guarding. No hernia.  Mildly tender at left lower quadrant  Genitourinary:  Genitourinary Comments: Pelvic exam normal external genitalia. Whitish vaginal discharge. Cervical os closed. Mild cervical motion tenderness. Positive left adnexal tenderness no masses. No right adnexal tenderness or masses  Musculoskeletal: Normal range of motion. She  exhibits no edema or tenderness.  Neurological: She is alert. Coordination normal.  Skin: Skin is warm and dry. No rash noted.  Psychiatric: She has a normal mood and affect.  Nursing note and vitals reviewed.    ED Treatments / Results  Labs (all labs ordered are listed, but only abnormal results are displayed) Labs Reviewed  WET PREP, GENITAL  CBC  LIPASE, BLOOD  COMPREHENSIVE METABOLIC PANEL  URINALYSIS, ROUTINE W REFLEX MICROSCOPIC  RPR  HIV ANTIBODY (ROUTINE TESTING)  POC URINE PREG, ED  GC/CHLAMYDIA PROBE AMP ([place]) NOT AT [facility]    EKG  EKG Interpretation None       Radiology No results found.  Procedures Procedures (including critical care time)  Medications Ordered in ED Medications - No data to display   Initial Impression / Assessment and Plan / ED Course  I have reviewed the triage vital signs and the nursing notes.  Pertinent labs & imaging results that were available during my care of the patient were reviewed by me and considered in my medical decision making (see chart for details).     Scrubs pain is mild after treatment with Tylenol. Patient signed out to Dr. Rush Landmark conjunction with Mr Laveda Norman, [place] at 8:10 PM Results for orders placed or performed during the hospital encounter of 06/03/16  Wet prep, genital  Result Value Ref Range   Yeast Wet Prep HPF POC NONE SEEN NONE SEEN   Trich, Wet Prep PRESENT (A) NONE SEEN   Clue Cells Wet Prep HPF POC NONE SEEN NONE SEEN   WBC, Wet Prep HPF POC MANY (A) NONE SEEN   Sperm NONE SEEN   Lipase, blood  Result Value Ref Range   Lipase 26 11 - 51 U/L  Comprehensive metabolic panel  Result Value Ref Range   Sodium 141 135 - 145 mmol/L   Potassium 3.4 (L) 3.5 - 5.1 mmol/L   Chloride 103 101 - 111 mmol/L   CO2 31 22 - 32 mmol/L   Glucose, Bld 71 65 - 99 mg/dL   BUN 9 6 - 20 mg/dL   Creatinine, Ser 4.09 0.44 - 1.00 mg/dL   Calcium 8.9 8.9 - 81.1 mg/dL   Total Protein 6.9 6.5 - 8.1 g/dL   Albumin  3.9 3.5 - 5.0 g/dL   AST 19 15 - 41 U/L   ALT 15 14 - 54 U/L   Alkaline Phosphatase 94 38 - 126 U/L   Total Bilirubin 0.7 0.3 - 1.2 mg/dL   GFR calc non Af Amer >60 >60 mL/min   GFR calc Af Amer >60 >60 mL/min   Anion gap 7 5 - 15  CBC  Result Value Ref Range   WBC 5.2 4.0 - 10.5 K/uL   RBC 4.78 3.87 - 5.11 MIL/uL   Hemoglobin 13.7 12.0 - 15.0 g/dL   HCT 91.4 78.2 - 95.6 %   MCV 83.5 78.0 - 100.0 fL   MCH 28.7 26.0 - 34.0 pg  MCHC 34.3 30.0 - 36.0 g/dL   RDW 09.612.5 04.511.5 - 40.915.5 %   Platelets 313 150 - 400 K/uL  Urinalysis, Routine w reflex microscopic  Result Value Ref Range   Color, Urine YELLOW YELLOW   APPearance CLEAR CLEAR   Specific Gravity, Urine 1.028 1.005 - 1.030   pH 6.0 5.0 - 8.0   Glucose, UA NEGATIVE NEGATIVE mg/dL   Hgb urine dipstick NEGATIVE NEGATIVE   Bilirubin Urine NEGATIVE NEGATIVE   Ketones, ur NEGATIVE NEGATIVE mg/dL   Protein, ur NEGATIVE NEGATIVE mg/dL   Nitrite NEGATIVE NEGATIVE   Leukocytes, UA NEGATIVE NEGATIVE  P[facility] preg[facility])  Result Value Ref Range   Preg Test, Ur NEGATIVE NEGATIVE   No results found. Pelvic ultrasound pending Final Clinical Impressions(s) / ED Diagnoses  Dx #1 Left lower quadrant abdominal pain #2 pelvic pain #3 trichomoniasis #4 hypokalemia Final diagnoses:  None    New Prescriptions New Prescriptions   No medications on file     Doug SouSam Imanii Gosdin, MD 06/03/16 2012

## 2016-06-04 LAB — HIV ANTIBODY (ROUTINE TESTING W REFLEX): HIV Screen 4th Generation wRfx: NONREACTIVE

## 2016-06-04 LAB — RPR: RPR Ser Ql: NONREACTIVE

## 2016-06-07 LAB — GC/CHLAMYDIA PROBE AMP (~~LOC~~) NOT AT ARMC
Chlamydia: NEGATIVE
Neisseria Gonorrhea: NEGATIVE

## 2016-06-18 ENCOUNTER — Emergency Department (HOSPITAL_COMMUNITY)
Admission: EM | Admit: 2016-06-18 | Discharge: 2016-06-18 | Disposition: A | Discharge status: Home / Self Care | Payer: Medicaid Other | Attending: Emergency Medicine | Admitting: Emergency Medicine

## 2016-06-18 ENCOUNTER — Encounter (HOSPITAL_COMMUNITY): Payer: Self-pay | Admitting: Emergency Medicine

## 2016-06-18 DIAGNOSIS — Z79899 Other long term (current) drug therapy: Secondary | ICD-10-CM | POA: Diagnosis not present

## 2016-06-18 DIAGNOSIS — Z87891 Personal history of nicotine dependence: Secondary | ICD-10-CM | POA: Diagnosis not present

## 2016-06-18 DIAGNOSIS — N939 Abnormal uterine and vaginal bleeding, unspecified: Secondary | ICD-10-CM | POA: Diagnosis present

## 2016-06-18 LAB — URINALYSIS, ROUTINE W REFLEX MICROSCOPIC
BACTERIA UA: NONE SEEN
Bilirubin Urine: NEGATIVE
Glucose, UA: NEGATIVE mg/dL
Ketones, ur: NEGATIVE mg/dL
Leukocytes, UA: NEGATIVE
NITRITE: NEGATIVE
PH: 5 (ref 5.0–8.0)
Protein, ur: 30 mg/dL — AB
SPECIFIC GRAVITY, URINE: 1.029 (ref 1.005–1.030)

## 2016-06-18 LAB — WET PREP, GENITAL
CLUE CELLS WET PREP: NONE SEEN
Sperm: NONE SEEN
TRICH WET PREP: NONE SEEN
YEAST WET PREP: NONE SEEN

## 2016-06-18 LAB — CBC WITH DIFFERENTIAL/PLATELET
Basophils Absolute: 0 10*3/uL (ref 0.0–0.1)
Basophils Relative: 0 %
Eosinophils Absolute: 0.1 10*3/uL (ref 0.0–0.7)
Eosinophils Relative: 2 %
HEMATOCRIT: 41.1 % (ref 36.0–46.0)
HEMOGLOBIN: 13.9 g/dL (ref 12.0–15.0)
LYMPHS ABS: 2.4 10*3/uL (ref 0.7–4.0)
Lymphocytes Relative: 54 %
MCH: 28.3 pg (ref 26.0–34.0)
MCHC: 33.8 g/dL (ref 30.0–36.0)
MCV: 83.7 fL (ref 78.0–100.0)
MONO ABS: 0.3 10*3/uL (ref 0.1–1.0)
MONOS PCT: 7 %
NEUTROS ABS: 1.6 10*3/uL — AB (ref 1.7–7.7)
NEUTROS PCT: 37 %
Platelets: 245 10*3/uL (ref 150–400)
RBC: 4.91 MIL/uL (ref 3.87–5.11)
RDW: 12.5 % (ref 11.5–15.5)
WBC: 4.3 10*3/uL (ref 4.0–10.5)

## 2016-06-18 LAB — COMPREHENSIVE METABOLIC PANEL
ALK PHOS: 120 U/L (ref 38–126)
ALT: 16 U/L (ref 14–54)
ANION GAP: 7 (ref 5–15)
AST: 18 U/L (ref 15–41)
Albumin: 3.7 g/dL (ref 3.5–5.0)
BILIRUBIN TOTAL: 1.1 mg/dL (ref 0.3–1.2)
BUN: 5 mg/dL — ABNORMAL LOW (ref 6–20)
CALCIUM: 8.7 mg/dL — AB (ref 8.9–10.3)
CO2: 26 mmol/L (ref 22–32)
Chloride: 107 mmol/L (ref 101–111)
Creatinine, Ser: 0.5 mg/dL (ref 0.44–1.00)
GFR calc Af Amer: >60 mL/min (ref >60)
Glucose, Bld: 100 mg/dL — ABNORMAL HIGH (ref 65–99)
POTASSIUM: 3.5 mmol/L (ref 3.5–5.1)
Sodium: 140 mmol/L (ref 135–145)
TOTAL PROTEIN: 7.5 g/dL (ref 6.5–8.1)

## 2016-06-18 LAB — LIPASE, BLOOD: Lipase: 22 U/L (ref 11–51)

## 2016-06-18 LAB — POC URINE PREG, ED: Preg Test, Ur: NEGATIVE

## 2016-06-18 NOTE — Discharge Instructions (Signed)
[facility]

## 2016-06-18 NOTE — ED Provider Notes (Signed)
[facility] Provider Note   CSN: [id number] Arrival date & time: 06/18/16  [id number]     History   Chief Complaint Chief Complaint  Patient presents with  . Vaginal Bleeding    HPI Tara Wallace is a 31 y.o. 815P3 female who presents with vaginal discharge and pelvic cramping. She first noticed dark brown/maroon vaginal discharge last night. It was associated with mild cramping that felt like a period. LMP was 05/14/16. She is concerned because she has had periods when she's been pregnant in the past. [facility]. Last time she had sexual intercourse was late February. She was seen on 3/8 and diagnosed with Trich and PID. She was given Flagyl 2g in the ED and d/c with Doxy which she completed. She states she was having white discharge which never got better even after treatment. She had a Pelvic and TVUS which was remarkable for small cyst but otherwise normal. She reports nausea but no vomiting. No fever, CP, SOB, diarrhea/constipation, dysuria, hematuria.   HPI  Past Medical History:  Diagnosis Date  . Graves disease     Patient Active Problem List   Diagnosis Date Noted  . Hyperthyroidism 06/17/2015    Past Surgical History:  Procedure Laterality Date  . ECTOPIC PREGNANCY SURGERY    . NOSE SURGERY    . WRIST SURGERY      OB History    No data available       Home Medications    Prior to Admission medications   Medication Sig Start Date End Date Taking? Authorizing Provider  doxycycline (VIBRAMYCIN) 100 MG capsule Take 1 capsule (100 mg total) by mouth 2 (two) times daily. 06/03/16   Fayrene HelperBowie Tran, PA-C  methimazole (TAPAZOLE) 10 MG tablet Take 4 tablets (40 mg total) by mouth 2 (two) times daily. 06/06/15   Fayrene HelperBowie Tran, PA-C  metoprolol succinate (TOPROL-XL) 100 MG 24 hr tablet Take 1 tablet (100 mg total) by mouth daily. Take with or immediately following a meal. Patient not taking: Reported on 06/03/2016 06/17/15   Romero BellingSean Ellison, MD  propranolol (INDERAL) 80 MG  tablet Take 1 tablet by mouth daily. 07/20/15   Historical Provider, MD    Family History Family History  Problem Relation Age of Onset  . Thyroid disease Neg Hx     Social History Social History  Substance Use Topics  . Smoking status: Former Games developermoker  . Smokeless tobacco: Never Used  . Alcohol use Yes     Comment: occasionally      Allergies   Penicillins   Review of Systems Review of Systems  Constitutional: Negative for fever.  Respiratory: Negative for shortness of breath.   Cardiovascular: Negative for chest pain.  Gastrointestinal: Positive for nausea. Negative for abdominal pain, constipation, diarrhea and vomiting.  Genitourinary: Positive for pelvic pain, vaginal bleeding and vaginal discharge. Negative for difficulty urinating, dysuria, genital sores and vaginal pain.     Physical Exam Updated Vital Signs BP 115/67 (BP Location: Right Arm)   Pulse 64   Temp 98 F (36.7 C) (Oral)   Resp 18   Ht 5\' 7"  (1.702 m)   Wt 81.6 kg   LMP 05/14/2016   SpO2 97%   BMI 28.19 kg/m   Physical Exam  Constitutional: She is oriented to person, place, and time. She appears well-developed and well-nourished. No distress.  HENT:  Head: Normocephalic and atraumatic.  Eyes: Conjunctivae are normal. Pupils are equal, round, and reactive to light. Right eye exhibits no  discharge. Left eye exhibits no discharge. No scleral icterus.  Neck: Normal range of motion.  Cardiovascular: Normal rate and regular rhythm.  Exam reveals no gallop and no friction rub.   No murmur heard. Pulmonary/Chest: Effort normal and breath sounds normal. No respiratory distress. She has no wheezes. She has no rales. She exhibits no tenderness.  Abdominal: Soft. Bowel sounds are normal. She exhibits no distension and no mass. There is no tenderness. There is no rebound and no guarding. No hernia.  Genitourinary:  Genitourinary Comments: Pelvic: No inguinal lymphadenopathy or inguinal hernia noted. Normal  external genitalia. No pain with speculum insertion. Closed cervical os with normal appearance - no rash or lesions. Moderate amount of bright red blood pooled in vaginal vault consistent with period. On bimanual examination no adnexal tenderness or cervical motion tenderness. Chaperone present during exam.     Neurological: She is alert and oriented to person, place, and time.  Skin: Skin is warm and dry.  Psychiatric: She has a normal mood and affect. Her behavior is normal.  Nursing note and vitals reviewed.    ED Treatments / Results  Labs (all labs ordered are listed, but only abnormal results are displayed) Labs Reviewed  WET PREP, GENITAL - Abnormal; Notable for the following:       Result Value   WBC, Wet Prep HPF POC FEW (*)    All other components within normal limits  CBC WITH DIFFERENTIAL/PLATELET - Abnormal; Notable for the following:    Neutro Abs 1.6 (*)    All other components within normal limits  COMPREHENSIVE METABOLIC PANEL - Abnormal; Notable for the following:    Glucose, Bld 100 (*)    BUN 5 (*)    Calcium 8.7 (*)    All other components within normal limits  URINALYSIS, ROUTINE W REFLEX MICROSCOPIC - Abnormal; Notable for the following:    APPearance HAZY (*)    Hgb urine dipstick LARGE (*)    Protein, ur 30 (*)    Squamous Epithelial / LPF 0-5 (*)    All other components within normal limits  LIPASE, BLOOD  RPR  HIV ANTIBODY (ROUTINE TESTING)  POC URINE PREG, ED  GC/CHLAMYDIA PROBE AMP () NOT AT [facility]    EKG  EKG Interpretation None       Radiology No results found.  Procedures Procedures (including critical care time)  Medications Ordered in ED Medications - No data to display   Initial Impression / Assessment and Plan / ED Course  I have reviewed the triage vital signs and the nursing notes.  Pertinent labs & imaging results that were available during my care of the patient were reviewed by me and considered in my medical  decision making (see chart for details).  31 year old female presents with vaginal period consistent with menstrual cycle. Preg test is negative. Vials are normal. Labs are overall normal. Wet prep is normal. UA is clean. She is asking for [facility] follow up. Referral provided. Return precautions given.  Final Clinical Impressions(s) / ED Diagnoses   Final diagnoses:  Vaginal bleeding    New Prescriptions New Prescriptions   No medications on file     Bethel Born, PA-C 06/18/16 1556    Melene Plan, DO 06/18/16 1607

## 2016-06-18 NOTE — ED Notes (Signed)
Phlebotomy at the bedside  

## 2016-06-18 NOTE — ED Triage Notes (Signed)
Pt states is unsure if she is having her period. She woke up with dark discharge this morning and some abd cramping. Last period feb 16th. Pt states the bleeding has stopped. Pt unsure if she is pregnant. When she was last seen here she was diagnosed with STD and is wondering if it was not treated properly.

## 2016-06-19 LAB — HIV ANTIBODY (ROUTINE TESTING W REFLEX): HIV SCREEN 4TH GENERATION: NONREACTIVE

## 2016-06-19 LAB — RPR: RPR: NONREACTIVE

## 2016-06-21 LAB — GC/CHLAMYDIA PROBE AMP (~~LOC~~) NOT AT ARMC
CHLAMYDIA, DNA PROBE: NEGATIVE
Neisseria Gonorrhea: NEGATIVE

## 2016-09-15 ENCOUNTER — Emergency Department (HOSPITAL_COMMUNITY)
Admission: EM | Admit: 2016-09-15 | Discharge: 2016-09-15 | Disposition: A | Discharge status: Home / Self Care | Payer: Medicaid Other | Attending: Emergency Medicine | Admitting: Emergency Medicine

## 2016-09-15 ENCOUNTER — Encounter (HOSPITAL_COMMUNITY): Payer: Self-pay | Admitting: Emergency Medicine

## 2016-09-15 DIAGNOSIS — A599 Trichomoniasis, unspecified: Secondary | ICD-10-CM

## 2016-09-15 DIAGNOSIS — N898 Other specified noninflammatory disorders of vagina: Secondary | ICD-10-CM

## 2016-09-15 DIAGNOSIS — Z79899 Other long term (current) drug therapy: Secondary | ICD-10-CM | POA: Insufficient documentation

## 2016-09-15 DIAGNOSIS — Z87891 Personal history of nicotine dependence: Secondary | ICD-10-CM | POA: Insufficient documentation

## 2016-09-15 LAB — URINALYSIS, ROUTINE W REFLEX MICROSCOPIC
Bilirubin Urine: NEGATIVE
Glucose, UA: NEGATIVE mg/dL
HGB URINE DIPSTICK: NEGATIVE
KETONES UR: NEGATIVE mg/dL
Leukocytes, UA: NEGATIVE
Nitrite: NEGATIVE
PROTEIN: NEGATIVE mg/dL
Specific Gravity, Urine: 1.03 (ref 1.005–1.030)
pH: 6 (ref 5.0–8.0)

## 2016-09-15 LAB — WET PREP, GENITAL
Clue Cells Wet Prep HPF POC: NONE SEEN
Sperm: NONE SEEN
Yeast Wet Prep HPF POC: NONE SEEN

## 2016-09-15 LAB — BASIC METABOLIC PANEL
ANION GAP: 5 (ref 5–15)
BUN: 9 mg/dL (ref 6–20)
CO2: 28 mmol/L (ref 22–32)
Calcium: 8.5 mg/dL — ABNORMAL LOW (ref 8.9–10.3)
Chloride: 104 mmol/L (ref 101–111)
Creatinine, Ser: 0.63 mg/dL (ref 0.44–1.00)
GFR calc Af Amer: >60 mL/min (ref >60)
Glucose, Bld: 101 mg/dL — ABNORMAL HIGH (ref 65–99)
POTASSIUM: 3.3 mmol/L — AB (ref 3.5–5.1)
Sodium: 137 mmol/L (ref 135–145)

## 2016-09-15 LAB — GC/CHLAMYDIA PROBE AMP (~~LOC~~) NOT AT ARMC
Chlamydia: NEGATIVE
NEISSERIA GONORRHEA: NEGATIVE

## 2016-09-15 LAB — PREGNANCY, URINE: PREG TEST UR: NEGATIVE

## 2016-09-15 MED ORDER — POTASSIUM CHLORIDE CRYS ER 20 MEQ PO TBCR
40.0000 meq | EXTENDED_RELEASE_TABLET | Freq: Once | ORAL | Status: AC
  Administered 2016-09-15: 40 meq via ORAL
  Filled 2016-09-15: qty 2

## 2016-09-15 MED ORDER — METRONIDAZOLE 500 MG PO TABS
2000.0000 mg | ORAL_TABLET | Freq: Once | ORAL | Status: AC
  Administered 2016-09-15: 2000 mg via ORAL
  Filled 2016-09-15: qty 4

## 2016-09-15 MED ORDER — CEFTRIAXONE SODIUM 250 MG IJ SOLR
250.0000 mg | Freq: Once | INTRAMUSCULAR | Status: AC
  Administered 2016-09-15: 250 mg via INTRAMUSCULAR
  Filled 2016-09-15: qty 250

## 2016-09-15 MED ORDER — AZITHROMYCIN 250 MG PO TABS
1000.0000 mg | ORAL_TABLET | Freq: Once | ORAL | Status: AC
  Administered 2016-09-15: 1000 mg via ORAL
  Filled 2016-09-15: qty 4

## 2016-09-15 NOTE — ED Notes (Signed)
Patient c/o clear vaginal discharge x 1 week states it has been heavier today , states she had to change her clothes . Denies odor. Also c/o insect bites to ankles.

## 2016-09-15 NOTE — ED Provider Notes (Signed)
[facility] Provider Note   CSN: [id number] Arrival date & time: 09/15/16  0059     History   Chief Complaint Chief Complaint  Patient presents with  . Vaginal Discharge    HPI Tara Wallace is a 31 y.o. female.  HPI  This is a 31 year old female with history of Graves' disease who presents with vaginal discharge.  Patient reports several day history of worsening clear vaginal discharge. She states that it is so bad that she has to change her close. She has a history of trichomoniasis with similar symptoms. Does report one episode of unprotected sex in early June. Unsure if she is pregnant. Last missed her period was June 5. Patient also reports "body spasms." She states that she feels like her potassium may be low. She denies any chest pain, shortness breath, fevers, abdominal pain.  Past Medical History:  Diagnosis Date  . Graves disease     Patient Active Problem List   Diagnosis Date Noted  . Hyperthyroidism 06/17/2015    Past Surgical History:  Procedure Laterality Date  . ECTOPIC PREGNANCY SURGERY    . NOSE SURGERY    . WRIST SURGERY      OB History    No data available       Home Medications    Prior to Admission medications   Medication Sig Start Date End Date Taking? Authorizing Provider  methimazole (TAPAZOLE) 10 MG tablet Take 4 tablets (40 mg total) by mouth 2 (two) times daily. 06/06/15  Yes Fayrene Helperran, Bowie, PA-C  propranolol (INDERAL) 80 MG tablet Take 80 mg by mouth 4 (four) times daily.  07/20/15  Yes [provider]  doxycycline (VIBRAMYCIN) 100 MG capsule Take 1 capsule (100 mg total) by mouth 2 (two) times daily. Patient not taking: Reported on 09/15/2016 06/03/16   Fayrene Helperran, Bowie, PA-C  metoprolol succinate (TOPROL-XL) 100 MG 24 hr tablet Take 1 tablet (100 mg total) by mouth daily. Take with or immediately following a meal. Patient not taking: Reported on 06/03/2016 06/17/15   Romero BellingEllison, Sean, MD    Family History Family History  Problem  Relation Age of Onset  . Thyroid disease Neg Hx     Social History Social History  Substance Use Topics  . Smoking status: Former Games developermoker  . Smokeless tobacco: Never Used  . Alcohol use Yes     Comment: occasionally      Allergies   Penicillins   Review of Systems Review of Systems  Constitutional: Negative for fever.  Respiratory: Negative for shortness of breath.   Cardiovascular: Negative for chest pain.  Gastrointestinal: Negative for abdominal pain.  Genitourinary: Positive for vaginal discharge. Negative for dysuria.  Musculoskeletal:       Muscle spasms  All other systems reviewed and are negative.    Physical Exam Updated Vital Signs BP 125/78 (BP Location: Left Arm)   Pulse 64   Temp 98.4 F (36.9 C) (Oral)   Resp 16   LMP 08/31/2016 (Exact Date)   SpO2 98%   Physical Exam  Constitutional: She is oriented to person, place, and time. She appears well-developed and well-nourished. No distress.  HENT:  Head: Normocephalic and atraumatic.  Cardiovascular: Normal rate, regular rhythm and normal heart sounds.   Pulmonary/Chest: Effort normal and breath sounds normal. No respiratory distress.  Abdominal: Soft. She exhibits no mass. There is no tenderness. There is no guarding.  Genitourinary:  Genitourinary Comments: Normal external vaginal exam, moderate vaginal discharge noted, no cervical friability noted  Neurological:  She is alert and oriented to person, place, and time.  Skin: Skin is warm and dry.  Psychiatric: She has a normal mood and affect.  Nursing note and vitals reviewed.    ED Treatments / Results  Labs (all labs ordered are listed, but only abnormal results are displayed) Labs Reviewed  WET PREP, GENITAL - Abnormal; Notable for the following:       Result Value   Trich, Wet Prep PRESENT (*)    WBC, Wet Prep HPF POC MANY (*)    All other components within normal limits  BASIC METABOLIC PANEL - Abnormal; Notable for the following:     Potassium 3.3 (*)    Glucose, Bld 101 (*)    Calcium 8.5 (*)    All other components within normal limits  URINALYSIS, ROUTINE W REFLEX MICROSCOPIC  PREGNANCY, URINE  I-STAT CHEM 8, ED  GC/CHLAMYDIA PROBE AMP ([place]) NOT AT [facility]    EKG  EKG Interpretation None       Radiology No results found.  Procedures Procedures (including critical care time)  Medications Ordered in ED Medications  cefTRIAXone (ROCEPHIN) injection 250 mg (250 mg Intramuscular Given 09/15/16 0608)  azithromycin (ZITHROMAX) tablet 1,000 mg (1,000 mg Oral Given 09/15/16 0608)  metroNIDAZOLE (FLAGYL) tablet 2,000 mg (2,000 mg Oral Given 09/15/16 1610)     Initial Impression / Assessment and Plan / ED Course  I have reviewed the triage vital signs and the nursing notes.  Pertinent labs & imaging results that were available during my care of the patient were reviewed by me and considered in my medical decision making (see chart for details).     Patient presents with chief complaint of vaginal discharge. Otherwise she reports generalized spasms and thinks she may have low potassium. She is nontoxic. Vital signs reassuring. Wet prep is positive for trichomoniasis. Patient was treated for GC, chlamydia, and trichomoniasis. Advised to abstain from sexual activity for the next 10 days and have partners tested and treated. Potassium 3.3. Marginally low and concerning.  After history, exam, and medical workup I feel the patient has been appropriately medically screened and is safe for discharge home. Pertinent diagnoses were discussed with the patient. Patient was given return precautions.   Final Clinical Impressions(s) / ED Diagnoses   Final diagnoses:  Vaginal discharge  Trichomoniasis    New Prescriptions New Prescriptions   No medications on file     Shon Baton, MD 09/15/16 [phone]

## 2016-09-15 NOTE — Discharge Instructions (Signed)
You were seen today for vaginal discharge. You were positive for trichomoniasis. You were treated for all STDs. Abstain from sexual activity for the next 10 days. Have her partners tested and treated.

## 2016-09-15 NOTE — ED Triage Notes (Signed)
Patient here with clear vaginal discharge, states that she has had to change her clothes multiple times today.  Patient also having what looks like insect bites on right ankle and on her back.

## 2016-10-19 ENCOUNTER — Ambulatory Visit: Payer: Medicaid Other | Admitting: Obstetrics

## 2016-11-06 ENCOUNTER — Encounter (HOSPITAL_COMMUNITY): Payer: Self-pay

## 2016-11-06 ENCOUNTER — Emergency Department (HOSPITAL_COMMUNITY)
Admission: EM | Admit: 2016-11-06 | Discharge: 2016-11-07 | Disposition: A | Discharge status: Home / Self Care | Payer: Medicaid Other | Attending: Emergency Medicine | Admitting: Emergency Medicine

## 2016-11-06 DIAGNOSIS — O219 Vomiting of pregnancy, unspecified: Secondary | ICD-10-CM | POA: Diagnosis present

## 2016-11-06 DIAGNOSIS — O9989 Other specified diseases and conditions complicating pregnancy, childbirth and the puerperium: Secondary | ICD-10-CM | POA: Insufficient documentation

## 2016-11-06 DIAGNOSIS — Z3A01 Less than 8 weeks gestation of pregnancy: Secondary | ICD-10-CM | POA: Diagnosis not present

## 2016-11-06 DIAGNOSIS — R1011 Right upper quadrant pain: Secondary | ICD-10-CM | POA: Insufficient documentation

## 2016-11-06 DIAGNOSIS — E0500 Thyrotoxicosis with diffuse goiter without thyrotoxic crisis or storm: Secondary | ICD-10-CM | POA: Diagnosis not present

## 2016-11-06 NOTE — ED Triage Notes (Signed)
Pt here for emesis, pt is pregnant unknown how far, pt reports last menstrual was in June. Pt has not had any prenatal care.

## 2016-11-07 ENCOUNTER — Emergency Department (HOSPITAL_COMMUNITY): Payer: Medicaid Other

## 2016-11-07 LAB — COMPREHENSIVE METABOLIC PANEL
ALBUMIN: 3.6 g/dL (ref 3.5–5.0)
ALT: 10 U/L — ABNORMAL LOW (ref 14–54)
ANION GAP: 8 (ref 5–15)
AST: 14 U/L — AB (ref 15–41)
Alkaline Phosphatase: 50 U/L (ref 38–126)
BUN: 7 mg/dL (ref 6–20)
CHLORIDE: 102 mmol/L (ref 101–111)
CO2: 25 mmol/L (ref 22–32)
Calcium: 9.7 mg/dL (ref 8.9–10.3)
Creatinine, Ser: 0.48 mg/dL (ref 0.44–1.00)
GFR calc Af Amer: >60 mL/min (ref >60)
GFR calc non Af Amer: >60 mL/min (ref >60)
GLUCOSE: 100 mg/dL — AB (ref 65–99)
POTASSIUM: 3.6 mmol/L (ref 3.5–5.1)
SODIUM: 135 mmol/L (ref 135–145)
Total Bilirubin: 0.5 mg/dL (ref 0.3–1.2)
Total Protein: 6.7 g/dL (ref 6.5–8.1)

## 2016-11-07 LAB — CBC
HEMATOCRIT: 37.2 % (ref 36.0–46.0)
HEMOGLOBIN: 12.9 g/dL (ref 12.0–15.0)
MCH: 29.3 pg (ref 26.0–34.0)
MCHC: 34.7 g/dL (ref 30.0–36.0)
MCV: 84.5 fL (ref 78.0–100.0)
Platelets: 264 10*3/uL (ref 150–400)
RBC: 4.4 MIL/uL (ref 3.87–5.11)
RDW: 11.7 % (ref 11.5–15.5)
WBC: 9 10*3/uL (ref 4.0–10.5)

## 2016-11-07 LAB — URINALYSIS, ROUTINE W REFLEX MICROSCOPIC
Bilirubin Urine: NEGATIVE
Glucose, UA: NEGATIVE mg/dL
Hgb urine dipstick: NEGATIVE
KETONES UR: NEGATIVE mg/dL
LEUKOCYTES UA: NEGATIVE
NITRITE: NEGATIVE
Protein, ur: NEGATIVE mg/dL
Specific Gravity, Urine: 1.014 (ref 1.005–1.030)
pH: 6 (ref 5.0–8.0)

## 2016-11-07 LAB — LIPASE, BLOOD: Lipase: 26 U/L (ref 11–51)

## 2016-11-07 MED ORDER — SODIUM CHLORIDE 0.9 % IV BOLUS (SEPSIS)
1000.0000 mL | Freq: Once | INTRAVENOUS | Status: AC
  Administered 2016-11-07: 1000 mL via INTRAVENOUS

## 2016-11-07 MED ORDER — PROMETHAZINE HCL 12.5 MG PO TABS: 12.5000 mg | ORAL_TABLET | Freq: Four times a day (QID) | ORAL | 0 refills | Status: AC | PRN

## 2016-11-07 MED ORDER — METOCLOPRAMIDE HCL 5 MG/ML IJ SOLN: 5.0000 mg | Freq: Once | INTRAMUSCULAR | Status: DC

## 2016-11-07 MED ORDER — PRENATAL COMPLETE 14-0.4 MG PO TABS: 0.4000 mg | ORAL_TABLET | Freq: Every day | ORAL | 0 refills | Status: AC

## 2016-11-07 MED ORDER — PROMETHAZINE HCL 25 MG/ML IJ SOLN
12.5000 mg | Freq: Once | INTRAMUSCULAR | Status: AC
  Administered 2016-11-07: 12.5 mg via INTRAVENOUS
  Filled 2016-11-07: qty 1

## 2016-11-07 NOTE — ED Notes (Signed)
Pt is aware urine is needed. Pt stated, "Not Right Now".

## 2016-11-07 NOTE — ED Provider Notes (Signed)
[facility]-EMERGENCY DEPT Provider Note   CSN: [id number] Arrival date & time: 11/06/16  2121     History   Chief Complaint Chief Complaint  Patient presents with  . Emesis    HPI Tara Wallace is a 31 y.o. female.  HPI  31 y.o. female [phone] (Patient's last menstrual period was 08/31/2016) presents to the Emergency Department today due to emesis. Pt states this occurs every time she eats. Notes mild RUQ abdominal pain with PO intake. Rates pain 1/10 currently. NO PO intake since yesterday due to emesis. Denies lower abdominal pain. No vaginal bleeding. No CP/SOB. She initially presented to ED on 7/18 after taking a home pregnancy test that was positive. [place] in the ED demonstrated a viable intrauterine pregnancy at [redacted]w[redacted]d. Pt has not been on prenatal vitamins. Denies fevers. No headaches. No visual changes. No other symptoms noted.   Past Medical History:  Diagnosis Date  . Graves disease     Patient Active Problem List   Diagnosis Date Noted  . Hyperthyroidism 06/17/2015    Past Surgical History:  Procedure Laterality Date  . ECTOPIC PREGNANCY SURGERY    . NOSE SURGERY    . WRIST SURGERY      OB History    No data available       Home Medications    Prior to Admission medications   Medication Sig Start Date End Date Taking? Authorizing Provider  doxycycline (VIBRAMYCIN) 100 MG capsule Take 1 capsule (100 mg total) by mouth 2 (two) times daily. Patient not taking: Reported on 09/15/2016 06/03/16   Fayrene Helper, PA-C  methimazole (TAPAZOLE) 10 MG tablet Take 4 tablets (40 mg total) by mouth 2 (two) times daily. 06/06/15   Fayrene Helper, PA-C  metoprolol succinate (TOPROL-XL) 100 MG 24 hr tablet Take 1 tablet (100 mg total) by mouth daily. Take with or immediately following a meal. Patient not taking: Reported on 06/03/2016 06/17/15   Romero Belling, MD  propranolol (INDERAL) 80 MG tablet Take 80 mg by mouth 4 (four) times daily.  07/20/15   [provider]    Family  History Family History  Problem Relation Age of Onset  . Thyroid disease Neg Hx     Social History Social History  Substance Use Topics  . Smoking status: Former Games developer  . Smokeless tobacco: Never Used  . Alcohol use Yes     Comment: occasionally      Allergies   Penicillins   Review of Systems Review of Systems ROS reviewed and all are negative for acute change except as noted in the HPI.  Physical Exam Updated Vital Signs BP 110/60 (BP Location: Left Arm)   Pulse 88   Temp 98.6 F (37 C) (Oral)   Resp 16   SpO2 100%   Physical Exam  Constitutional: She is oriented to person, place, and time. Vital signs are normal. She appears well-developed and well-nourished.  No active emesis. NAD  HENT:  Head: Normocephalic and atraumatic.  Right Ear: Hearing normal.  Left Ear: Hearing normal.  Eyes: Pupils are equal, round, and reactive to light. Conjunctivae and EOM are normal.  Neck: Normal range of motion. Neck supple.  Cardiovascular: Normal rate, regular rhythm, normal heart sounds and intact distal pulses.   Pulmonary/Chest: Effort normal and breath sounds normal.  Abdominal: Soft. Normal appearance and bowel sounds are normal. There is tenderness in the right upper quadrant. There is no rigidity, no rebound, no guarding, no CVA tenderness, no tenderness at McBurney's point and  negative Murphy's sign.  Musculoskeletal: Normal range of motion.  Neurological: She is alert and oriented to person, place, and time.  Skin: Skin is warm and dry.  Psychiatric: She has a normal mood and affect. Her speech is normal and behavior is normal. Thought content normal.  Nursing note and vitals reviewed.  ED Treatments / Results  Labs (all labs ordered are listed, but only abnormal results are displayed) Labs Reviewed  COMPREHENSIVE METABOLIC PANEL - Abnormal; Notable for the following:       Result Value   Glucose, Bld 100 (*)    AST 14 (*)    ALT 10 (*)    All other  components within normal limits  CBC  LIPASE, BLOOD  URINALYSIS, ROUTINE W REFLEX MICROSCOPIC    EKG  EKG Interpretation None       Radiology US Abdomen Limited Ruq  Result Date: 11/07/2016 CLINICAL DATA:  Acute onset of generalized abdominal pain and tenderness. Nausea and vomiting. Initial encounter. EXAM: ULTRASOUND ABDOMEN LIMITED RIGHT UPPER QUADRANT COMPARISON:  None. FINDINGS: Gallbladder: No gallstones or wall thickening visualized. No sonographic Murphy sign noted by sonographer. Common bile duct: Diameter: 0.2 cm, within normal limits in caliber. Liver: No focal lesion identified. Within normal limits in parenchymal echogenicity. IMPRESSION: Unremarkable ultrasound of the right upper quadrant. Electronically Signed   By: Roanna Raider M.D.   On: 11/07/2016 02:41    Procedures Procedures (including critical care time)  Medications Ordered in ED Medications - No data to display   Initial Impression / Assessment and Plan / ED Course  I have reviewed the triage vital signs and the nursing notes.  Pertinent labs & imaging results that were available during my care of the patient were reviewed by me and considered in my medical decision making (see chart for details).  Final Clinical Impressions(s) / ED Diagnoses     {I have reviewed the relevant previous healthcare records.  {I obtained HPI from historian.   ED Course:  Assessment: Pt is a 31 y.o. female [phone] (Patient's last menstrual period was 08/31/2016) presents to the Emergency Department today due to emesis. Pt states this occurs every time she eats. Notes mild RUQ abdominal pain with PO intake. Rates pain 1/10 currently. NO PO intake since yesterday due to emesis. Denies lower abdominal pain. No vaginal bleeding. No CP/SOB. She initially presented to ED on 7/18 after taking a home pregnancy test that was positive. [place] in the ED demonstrated a viable intrauterine pregnancy at [redacted]w[redacted]d. Pt has not been on prenatal  vitamins. Denies fevers. No headaches. No visual changes. On exam, pt in NAD. Nontoxic/nonseptic appearing. VSS. Afebrile. Lungs CTA. Heart RRR. Abdomen TTP RUQ. Due to inability to tolerate PO, will eval cholecystitis. CBC unremarkable. CMP unremarkable. Lipase negative. RUQ [place] negative. Given fluids and phenergan in ED. Plan is to DC home with anti emetics and follow up with GYN. Given Rx Prenatal vitamins and phenergan. At time of discharge, Patient is in no acute distress. Vital Signs are stable. Patient is able to ambulate. Patient able to tolerate PO.   Disposition/Plan:  DC Home Additional Verbal discharge instructions given and discussed with patient.  Pt Instructed to f/u with GYN in the next week for evaluation and treatment of symptoms. Return precautions given Pt acknowledges and agrees with plan  Supervising Physician Dione Booze, MD  Final diagnoses:  Nausea and vomiting during pregnancy    New Prescriptions New Prescriptions   No medications on file  Audry PiliMohr, Sunshyne Horvath, PA-C 11/07/16 0414    Dione BoozeGlick, David, MD 11/07/16 [id number]

## 2016-11-07 NOTE — Discharge Instructions (Signed)
Please read and follow all provided instructions.  Your diagnoses today include:  1. Nausea and vomiting during pregnancy     Tests performed today include: Vital signs. See below for your results today.   Medications prescribed:  Take as prescribed   Home care instructions:  Follow any educational materials contained in this packet.  Follow-up instructions: Please follow-up with your OBGYN for further evaluation of symptoms and treatment   Return instructions:  Please return to the Emergency Department if you do not get better, if you get worse, or new symptoms OR  - Fever (temperature greater than 101.27F)  - Bleeding that does not stop with holding pressure to the area    -Severe pain (please note that you may be more sore the day after your accident)  - Chest Pain  - Difficulty breathing  - Severe nausea or vomiting  - Inability to tolerate food and liquids  - Passing out  - Skin becoming red around your wounds  - Change in mental status (confusion or lethargy)  - New numbness or weakness    Please return if you have any other emergent concerns.  Additional Information:  Your vital signs today were: BP 110/60 (BP Location: Left Arm)    Pulse 88    Temp 98.6 F (37 C) (Oral)    Resp 16    SpO2 100%  If your blood pressure (BP) was elevated above 135/85 this visit, please have this repeated by your doctor within one month. ---------------

## 2016-11-07 NOTE — ED Notes (Signed)
Pt refusing to give a urine sample or have In and out catheter.

## 2016-11-07 NOTE — ED Notes (Signed)
PO challenge started

## 2016-11-11 ENCOUNTER — Encounter: Payer: Medicaid Other | Admitting: Certified Nurse Midwife

## 2019-06-03 IMAGING — US US ABDOMEN LIMITED
1 series · 14 of 25 positions shown · non-contrast
Comparison: None.

CLINICAL DATA: Acute onset of generalized abdominal pain and
tenderness. Nausea and vomiting. Initial encounter.

EXAM:
ULTRASOUND ABDOMEN LIMITED RIGHT UPPER QUADRANT

[Series 1: us abdomen limited · 0.20mm/px · 14 of 44 slices shown]
[im 1/44]
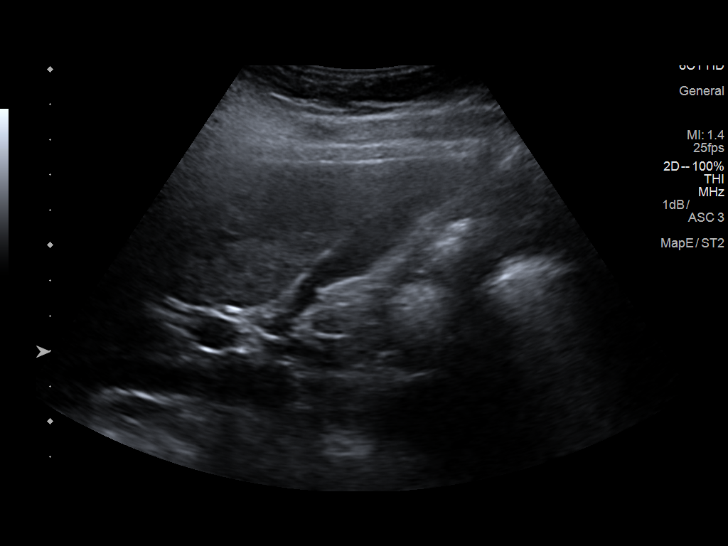
[im 4/44]
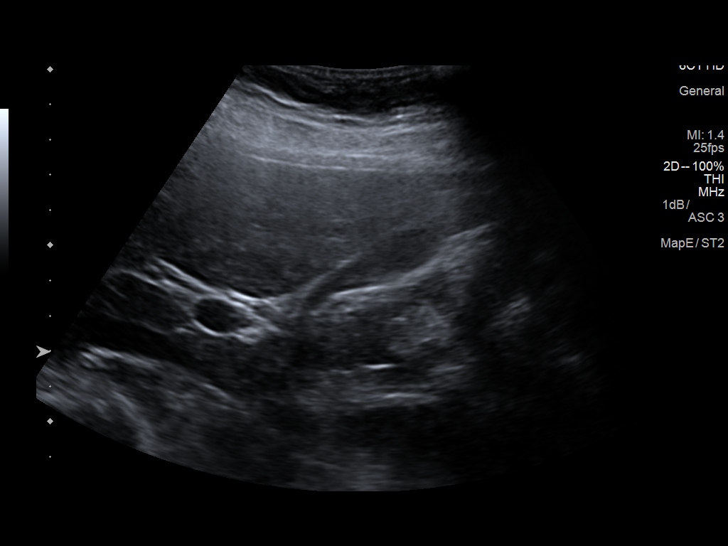
[im 8/44]
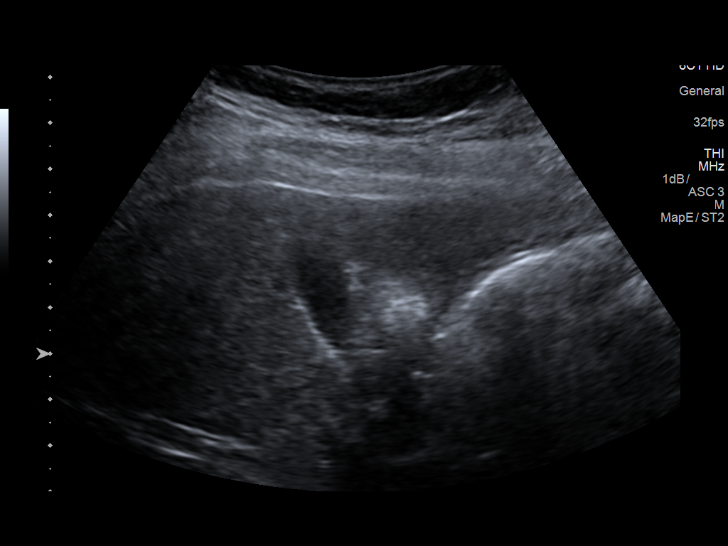
[im 11/44]
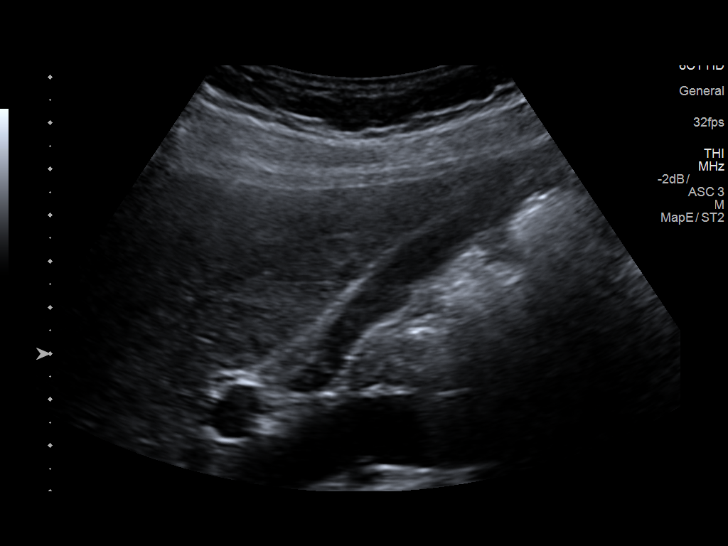
[im 15/44]
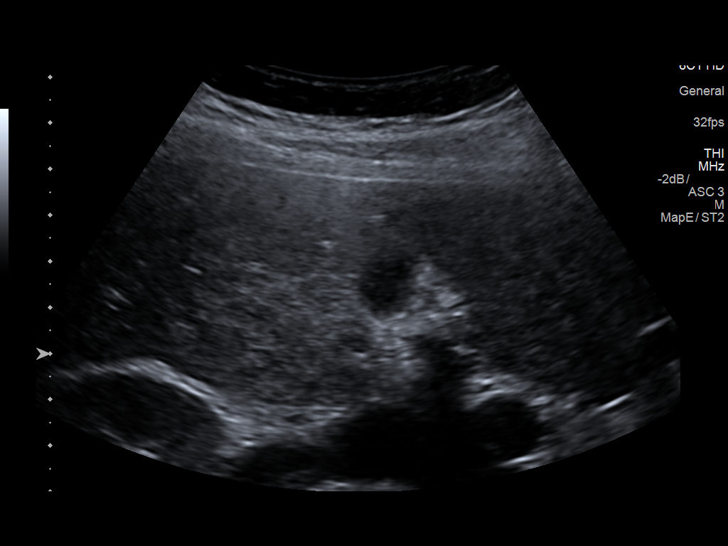
[im 17/44]
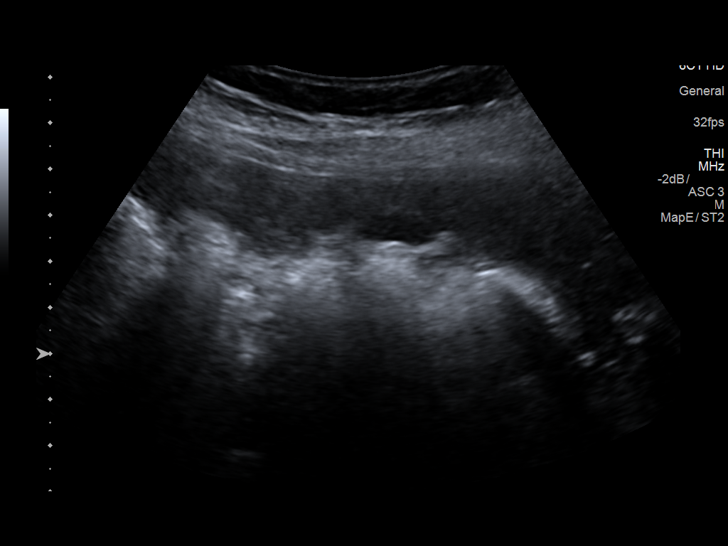
[im 20/44]
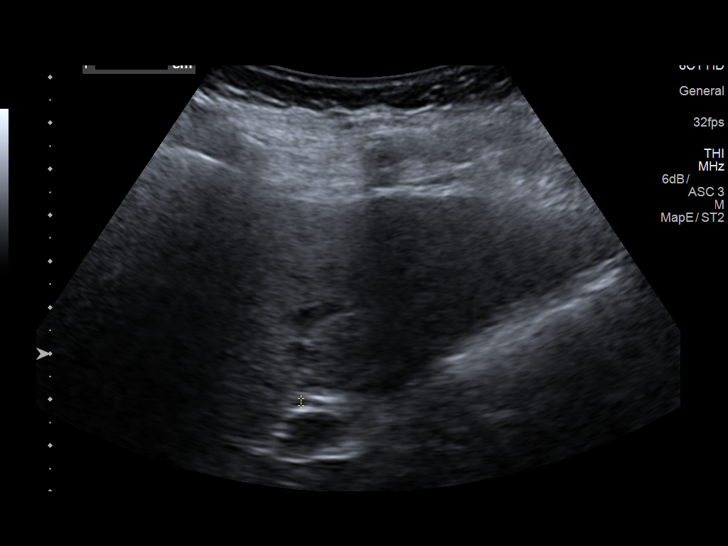
[im 24/44]
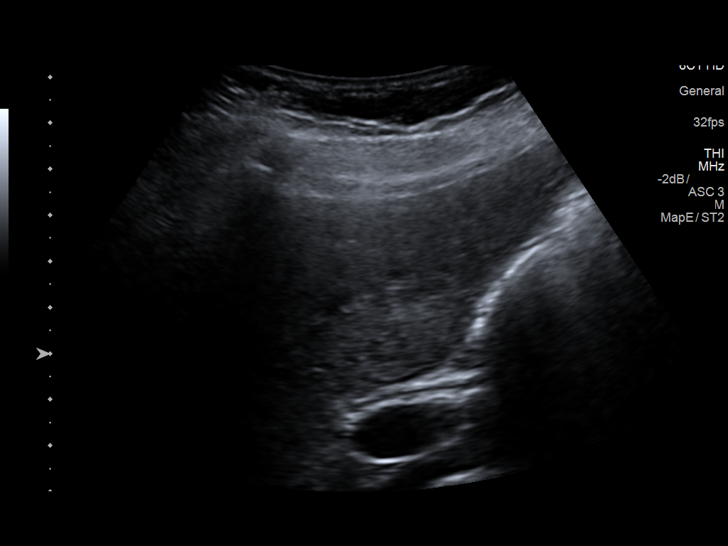
[im 27/44]
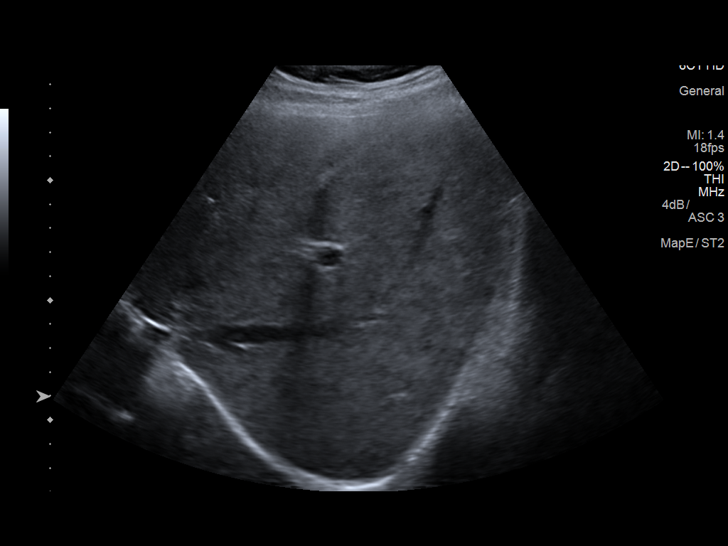
[im 29/44]
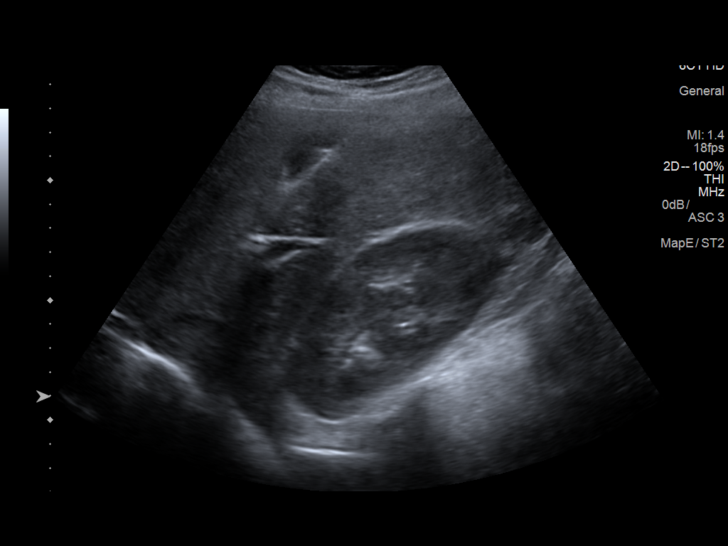
[im 33/44]
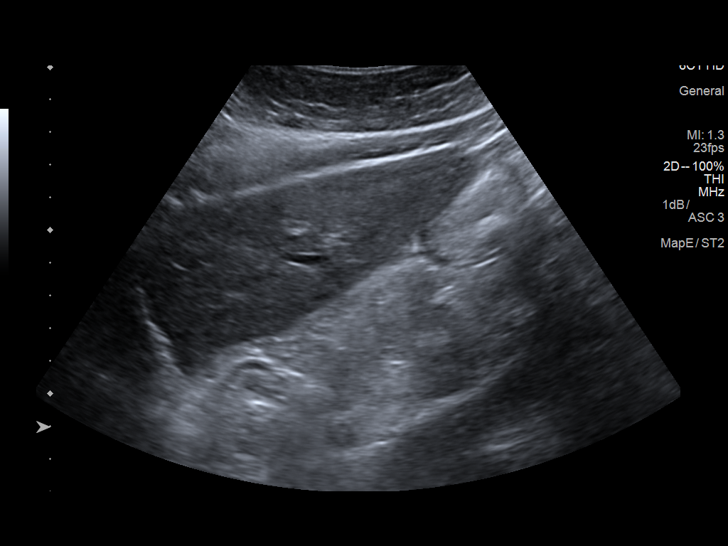
[im 36/44]
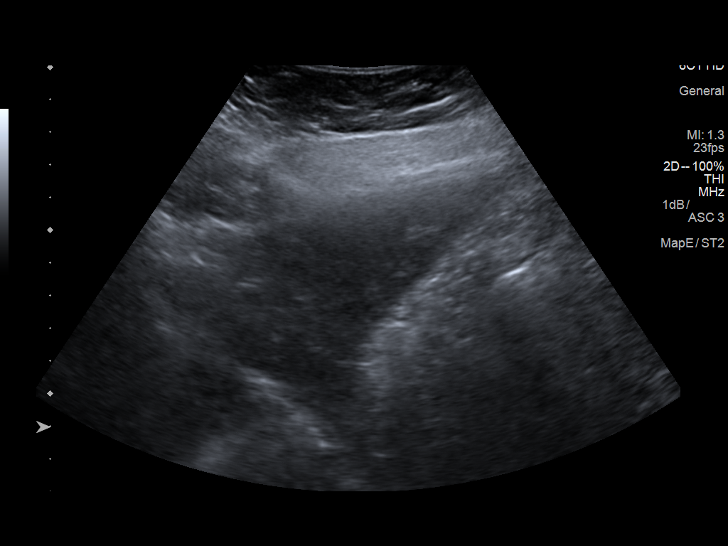
[im 40/44]
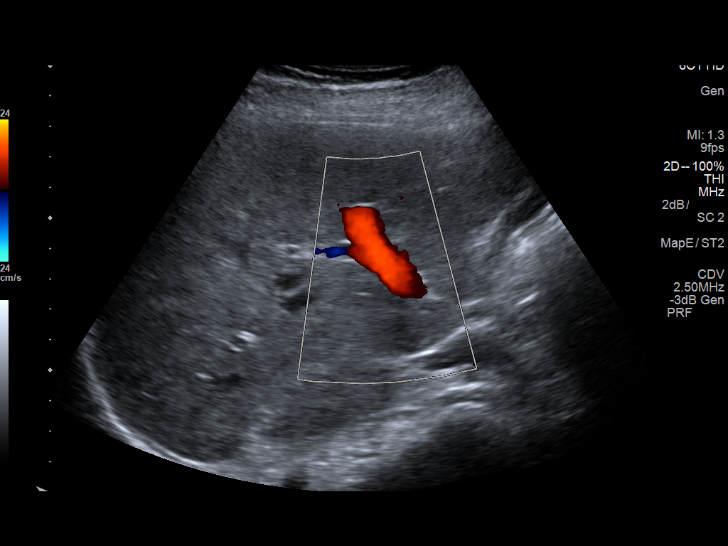
[im 44/44]
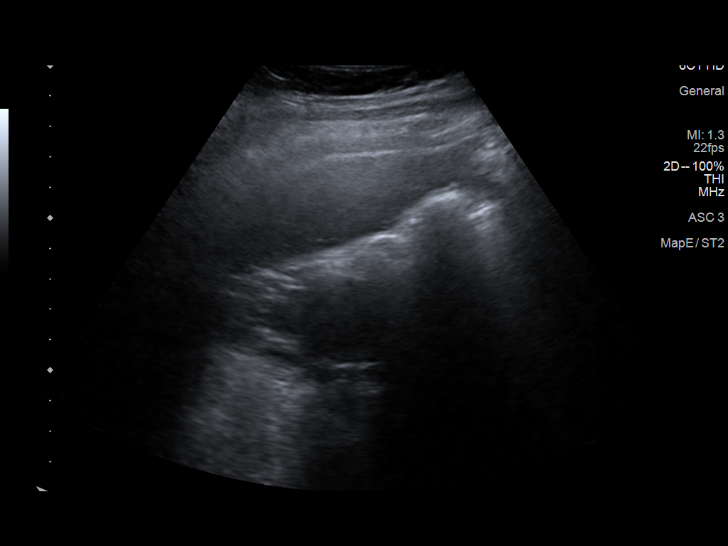

[14 of 25 positions shown; findings below may reference images not displayed]

FINDINGS: Gallbladder:

No gallstones or wall thickening visualized. No sonographic Murphy
sign noted by sonographer.

Common bile duct:

Diameter: 0.2 cm, within normal limits in caliber.

Liver:

No focal lesion identified. Within normal limits in parenchymal
echogenicity.
IMPRESSION: Unremarkable ultrasound of the right upper quadrant.
